# Patient Record
Sex: Female | Born: 1970
Health system: Southern US, Community
[De-identification: ages and names within clinical notes are randomized; demographics above are authoritative.]

## PROBLEM LIST (undated history)

## (undated) DIAGNOSIS — M549 Dorsalgia, unspecified: Secondary | ICD-10-CM

## (undated) DIAGNOSIS — K219 Gastro-esophageal reflux disease without esophagitis: Secondary | ICD-10-CM

## (undated) DIAGNOSIS — G8929 Other chronic pain: Secondary | ICD-10-CM

## (undated) DIAGNOSIS — J329 Chronic sinusitis, unspecified: Secondary | ICD-10-CM

## (undated) DIAGNOSIS — R51 Headache: Secondary | ICD-10-CM

## (undated) DIAGNOSIS — N760 Acute vaginitis: Secondary | ICD-10-CM

## (undated) DIAGNOSIS — M1990 Unspecified osteoarthritis, unspecified site: Secondary | ICD-10-CM

## (undated) DIAGNOSIS — R197 Diarrhea, unspecified: Secondary | ICD-10-CM

## (undated) DIAGNOSIS — R42 Dizziness and giddiness: Secondary | ICD-10-CM

## (undated) DIAGNOSIS — N898 Other specified noninflammatory disorders of vagina: Secondary | ICD-10-CM

## (undated) DIAGNOSIS — R011 Cardiac murmur, unspecified: Secondary | ICD-10-CM

## (undated) DIAGNOSIS — B9689 Other specified bacterial agents as the cause of diseases classified elsewhere: Secondary | ICD-10-CM

## (undated) DIAGNOSIS — I639 Cerebral infarction, unspecified: Secondary | ICD-10-CM

## (undated) DIAGNOSIS — R531 Weakness: Secondary | ICD-10-CM

## (undated) HISTORY — DX: Other specified noninflammatory disorders of vagina: N89.8

## (undated) HISTORY — DX: Other specified bacterial agents as the cause of diseases classified elsewhere: B96.89

## (undated) HISTORY — DX: Acute vaginitis: N76.0

---
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
## 1989-12-15 HISTORY — PX: LAPAROSCOPIC ENDOMETRIOSIS FULGURATION: SUR769

## 2002-12-15 DIAGNOSIS — R011 Cardiac murmur, unspecified: Secondary | ICD-10-CM

## 2002-12-15 HISTORY — DX: Cardiac murmur, unspecified: R01.1

## 2005-05-15 ENCOUNTER — Emergency Department (HOSPITAL_COMMUNITY): Admission: EM | Admit: 2005-05-15 | Discharge: 2005-05-15 | Payer: Self-pay | Admitting: *Deleted

## 2006-09-23 ENCOUNTER — Other Ambulatory Visit: Admission: RE | Admit: 2006-09-23 | Discharge: 2006-09-23 | Payer: Self-pay | Admitting: Obstetrics and Gynecology

## 2006-09-29 ENCOUNTER — Encounter: Admission: RE | Admit: 2006-09-29 | Discharge: 2006-09-29 | Payer: Self-pay | Admitting: Obstetrics and Gynecology

## 2006-10-27 ENCOUNTER — Ambulatory Visit (HOSPITAL_COMMUNITY): Admission: RE | Admit: 2006-10-27 | Discharge: 2006-10-27 | Payer: Self-pay | Admitting: Obstetrics and Gynecology

## 2008-04-11 ENCOUNTER — Other Ambulatory Visit: Admission: RE | Admit: 2008-04-11 | Discharge: 2008-04-11 | Payer: Self-pay | Admitting: Obstetrics and Gynecology

## 2008-10-05 ENCOUNTER — Encounter: Payer: Self-pay | Admitting: Obstetrics & Gynecology

## 2008-10-05 ENCOUNTER — Inpatient Hospital Stay (HOSPITAL_COMMUNITY): Admission: AD | Admit: 2008-10-05 | Discharge: 2008-10-07 | Payer: Self-pay | Admitting: Obstetrics & Gynecology

## 2008-10-05 ENCOUNTER — Ambulatory Visit: Payer: Self-pay | Admitting: Physician Assistant

## 2008-10-06 ENCOUNTER — Encounter: Payer: Self-pay | Admitting: Obstetrics & Gynecology

## 2009-01-04 ENCOUNTER — Other Ambulatory Visit: Admission: RE | Admit: 2009-01-04 | Discharge: 2009-01-04 | Payer: Self-pay | Admitting: Obstetrics and Gynecology

## 2010-01-09 ENCOUNTER — Other Ambulatory Visit: Admission: RE | Admit: 2010-01-09 | Discharge: 2010-01-09 | Payer: Self-pay | Admitting: Obstetrics and Gynecology

## 2010-09-01 ENCOUNTER — Emergency Department (HOSPITAL_COMMUNITY): Admission: EM | Admit: 2010-09-01 | Discharge: 2010-09-01 | Payer: Self-pay | Admitting: Family Medicine

## 2011-01-15 ENCOUNTER — Other Ambulatory Visit (HOSPITAL_COMMUNITY)
Admission: RE | Admit: 2011-01-15 | Discharge: 2011-01-15 | Disposition: A | Discharge status: Home / Self Care | Payer: Medicaid Other | Source: Ambulatory Visit | Attending: Obstetrics and Gynecology | Admitting: Obstetrics and Gynecology

## 2011-01-15 ENCOUNTER — Other Ambulatory Visit: Payer: Self-pay | Admitting: Obstetrics and Gynecology

## 2011-01-15 DIAGNOSIS — Z01419 Encounter for gynecological examination (general) (routine) without abnormal findings: Secondary | ICD-10-CM | POA: Insufficient documentation

## 2011-02-27 LAB — POCT URINALYSIS DIPSTICK
Ketones, ur: NEGATIVE mg/dL
Nitrite: NEGATIVE
Protein, ur: NEGATIVE mg/dL
Urobilinogen, UA: 0.2 mg/dL (ref 0.0–1.0)
pH: 5 (ref 5.0–8.0)

## 2011-02-27 LAB — CBC
HCT: 41.1 % (ref 36.0–46.0)
Hemoglobin: 14 g/dL (ref 12.0–15.0)
MCHC: 34.1 g/dL (ref 30.0–36.0)
RBC: 4.2 MIL/uL (ref 3.87–5.11)

## 2011-02-27 LAB — COMPREHENSIVE METABOLIC PANEL
ALT: 9 U/L (ref 0–35)
AST: 17 U/L (ref 0–37)
Alkaline Phosphatase: 84 U/L (ref 39–117)
CO2: 26 mEq/L (ref 19–32)
Calcium: 9.2 mg/dL (ref 8.4–10.5)
Chloride: 107 mEq/L (ref 96–112)
GFR calc Af Amer: >60 mL/min (ref >60)
GFR calc non Af Amer: >60 mL/min (ref >60)
Glucose, Bld: 102 mg/dL — ABNORMAL HIGH (ref 70–99)
Sodium: 140 mEq/L (ref 135–145)
Total Bilirubin: 0.2 mg/dL — ABNORMAL LOW (ref 0.3–1.2)

## 2011-02-27 LAB — URINE CULTURE: Culture  Setup Time: 201109190041

## 2011-02-27 LAB — DIFFERENTIAL
Basophils Absolute: 0 10*3/uL (ref 0.0–0.1)
Basophils Relative: 0 % (ref 0–1)
Eosinophils Absolute: 0.1 10*3/uL (ref 0.0–0.7)
Eosinophils Relative: 1 % (ref 0–5)
Lymphs Abs: 2.8 10*3/uL (ref 0.7–4.0)
Neutrophils Relative %: 63 % (ref 43–77)

## 2011-02-27 LAB — POCT PREGNANCY, URINE: Preg Test, Ur: NEGATIVE

## 2011-02-27 LAB — LIPASE, BLOOD: Lipase: 26 U/L (ref 11–59)

## 2011-02-27 LAB — POCT H PYLORI SCREEN: H. PYLORI SCREEN, POC: NEGATIVE

## 2011-05-02 NOTE — H&P (Signed)
NAMEPRECIOSA, BUNDRICK                 ACCOUNT NO.:  [id number]   MEDICAL RECORD NO.:  [id number]          PATIENT TYPE:  AMB   LOCATION:  SDC                           FACILITY:  [facility]   PHYSICIAN:  Sherron Monday, MD        DATE OF BIRTH:  May 02, 1971   DATE OF ADMISSION:  10/27/2006  DATE OF DISCHARGE:                                HISTORY & PHYSICAL   REASON FOR ADMISSION:  Patient is being admitted for operative laparoscopy  on October 27, 2006.   HISTORY OF PRESENT ILLNESS:  Ms. Muntean is a 40 year old Caucasian female,  G3, P0-0-3-0 who presents with a history of endometriosis and desirous of a  pregnancy.  She states she has dysmenorrhea and menorrhagia, clots, heavy  bleeding and has no cramping with her menses in the past.  She has taken  Lupron for her endometriosis.   PAST MEDICAL HISTORY:  Significant for asthma.  She has never been  hospitalized.   PAST SURGICAL HISTORY:  Significant for a right knee surgery, surgery on her  vulva, she describes it as a polyp surgery.  She has had a laparoscopy for  endometriosis that was fulgurated.   MEDICATIONS:  None.   ALLERGIES:  No known drug allergies.   SOCIAL HISTORY:  She admits to one-half pack per day tobacco use.  Occasional alcohol use.  No drugs.  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   PAST OBSTETRICAL HISTORY:  She is a G3, P0-0-3-0 with G1-G3 being first  trimester therapeutic abortions.  She has never had an abnormal Pap smear.  Her last was on September 23, 2006 and was within normal limits.  No history  of any sexually transmitted diseases.  Menarche was at age 74.  She states  she has monthly cycles and occasionally these are regular, they last four  days and they are heavy.  She states she occasionally has severe cramping.  She is sexually active, monogamous with a single female partner. She states  she has dyspareunia, no intermenstrual bleeding or postcoital bleeding.   FAMILY HISTORY:   Significant for diabetes, hypertension, unknown type of  cancer and questionable for coronary artery disease.   PHYSICAL EXAMINATION:  VITAL SIGNS:  She is 5 feet, 2 inches tall and weighs  112 pounds.  She had a recent blood pressure of 110/76.  GENERAL APPEARANCE:  She is in no apparent distress.  CARDIOVASCULAR:  Regular rate and rhythm.  LUNGS:  Clear to auscultation bilaterally.  HEENT:  Mucous membranes are moist.  NECK:  Without lymphadenopathy.  BACK:  No costovertebral angle tenderness.  ABDOMEN:  Soft, nontender, nondistended with positive bowel sounds.  EXTREMITIES:  Symmetric and nontender.  PELVIC:  Normal external female genitalia, normal BUS, good support.  On  speculum exam patient was very uncomfortable.  Cervix and vagina without  lesions.  No cervical motion tenderness.  Normal uterus.  The right ovary  was enlarged and nontender.  The left ovary with no masses and nontender.  The uterosacral ligaments were palpated and  thought to be tender with  questionable studding.   She was given advice regarding progesterone-only pills, Depo, Lupron,  Implanon or a total abdominal hysterectomy, bilateral salpingo-oophorectomy  or operative laparoscopy.  She stated she is trying to get pregnant, she  would like to go the more conservative route and have a laparoscopy to treat  any endometriosis.  She was also recommended to quit smoking.  She was  started on prenatal vitamins and advised ovulation predictor kits.  She had  a mammogram as her breast exam revealed some cystic lesions which was within  normal limits.  She is advised of the risks, benefits and alternatives of  this surgical procedure and understands these risks.  She will present for  the surgery on October 27, 2006.      Sherron Monday, MD  Electronically Signed     JB/MEDQ  D:  10/26/2006  T:  10/26/2006  Job:  [id number]

## 2011-05-02 NOTE — Op Note (Signed)
NAME:  Tiffany Barry, Tiffany Barry                 ACCOUNT NO.:  [id number]   MEDICAL RECORD NO.:  [id number]          PATIENT TYPE:  AMB   LOCATION:  SDC                           [facility]ILITY[facility]   PHYSICIAN:  Sherron Monday, MD        DATE OF BIRTH:  [birth year]   DATE OF PROCEDURE:  10/27/2006  DATE OF DISCHARGE:                                 OPERATIVE REPORT   PREOPERATIVE DIAGNOSIS:  Pelvic pain, dysmenorrhea, history of  endometriosis.   POSTOPERATIVE DIAGNOSIS:  Pelvic pain, dysmenorrhea, history of  endometriosis.   OPERATION PERFORMED:  Diagnostic laparoscopy, chromotubation.   SURGEON:  Sherron Monday, MD   ASSISTANT:  Zenaida Niece, M.D.   ANESTHESIA:  General.   PATHOLOGY:  None.   ESTIMATED BLOOD LOSS:  Minimal.   URINE OUTPUT:  100 mL by in and out cath at start of the procedure.   COMPLICATIONS:  None.   DESCRIPTION OF PROCEDURE:  After risks, benefits and alternatives of the  surgical procedure were discussed with the patient.  She was transported  back to the operating room, prepped and draped in the normal sterile fashion  after general anesthesia was obtained.  A ZUMI uterine manipulator was  placed without difficulty after the uterus sounded to approximately 6 cm and  approximately 5 mm infraumbilical incision was made after being infiltrated  with 0.25% Marcaine, using the Veress needle, pneumoperitoneum was obtained  after several attempts had been made to assure that the Veress was in the  abdomen using direct visualization.  The 5 mm scope and trocar were then  placed infraumbilically.  A brief survey of the pelvis was performed  revealing a normal uterus, normal-appearing tubes and ovaries bilaterally,  normal appendix, no endometriosis was noted.  Approximately 5 mm port was  placed on the right side to help with visualization using a blunt trocar.  Visualization was assisted.  Methylene blue was instilled by the ZUMI  manipulator to ensure both tubes were  patent.  Pictures were taken to  confirm this.  The ports were removed under direct visualization.  The gas  was drained from her abdomen.  The ports were closed with 4-0 Vicryl in a  subcuticular fashion.  Sponge, lap, and needle counts were correct times two  by the operating room staff.  After the ZUMI manipulator was removed, she was returned to the supine  position.  The patient tolerated the procedure well.  Again, sponge, lap and  needle counts were correct times two per the operating room staff.  She was  then transported in stable condition to post anesthesia care unit.      Sherron Monday, MD  Electronically Signed     JB/MEDQ  D:  10/27/2006  T:  10/27/2006  Job:  [id number]

## 2011-06-19 ENCOUNTER — Encounter (HOSPITAL_COMMUNITY): Payer: Self-pay

## 2011-06-19 ENCOUNTER — Encounter (HOSPITAL_COMMUNITY)
Admission: RE | Admit: 2011-06-19 | Discharge: 2011-06-19 | Disposition: A | Discharge status: Home / Self Care | Payer: Medicaid Other | Source: Ambulatory Visit | Attending: Obstetrics and Gynecology | Admitting: Obstetrics and Gynecology

## 2011-06-19 ENCOUNTER — Other Ambulatory Visit: Payer: Self-pay | Admitting: Obstetrics and Gynecology

## 2011-06-19 HISTORY — DX: Other chronic pain: G89.29

## 2011-06-19 HISTORY — DX: Gastro-esophageal reflux disease without esophagitis: K21.9

## 2011-06-19 HISTORY — DX: Dorsalgia, unspecified: M54.9

## 2011-06-19 HISTORY — DX: Chronic sinusitis, unspecified: J32.9

## 2011-06-19 LAB — HCG, QUANTITATIVE, PREGNANCY: hCG, Beta Chain, Quant, S: 1 m[IU]/mL (ref <5)

## 2011-06-19 LAB — SURGICAL PCR SCREEN
MRSA, PCR: NEGATIVE
Staphylococcus aureus: NEGATIVE

## 2011-06-19 LAB — CBC
HCT: 37.4 % (ref 36.0–46.0)
MCV: 99.5 fL (ref 78.0–100.0)
RBC: 3.76 MIL/uL — ABNORMAL LOW (ref 3.87–5.11)
RDW: 12.5 % (ref 11.5–15.5)
WBC: 9.5 10*3/uL (ref 4.0–10.5)

## 2011-06-19 LAB — BASIC METABOLIC PANEL
CO2: 23 mEq/L (ref 19–32)
Chloride: 107 mEq/L (ref 96–112)
GFR calc Af Amer: >60 mL/min (ref >60)
Potassium: 4.5 mEq/L (ref 3.5–5.1)

## 2011-06-19 NOTE — Patient Instructions (Signed)
20 Tiffany Barry  06/19/2011   Your procedure is scheduled on:  06/24/11, Tuesday  Report to [facility] at 06:15 AM.  Call this number if you have problems the morning of surgery: [phone]   Remember:   Do not eat food:After Midnight.  Do not drink clear liquids: After Midnight.  Take these medicines the morning of surgery with A SIP OF WATER: prilosec, albuterol. Bring inhaler and do bowel prep; Mag Citrate 1 bottle.   Do not wear jewelry, make-up or nail polish.  Do not bring valuables to the hospital.  Contacts, dentures or bridgework may not be worn into surgery.  Leave suitcase in the car. After surgery it may be brought to your room.  For patients admitted to the hospital, checkout time is 11:00 AM the day of discharge.   Patients discharged the day of surgery will not be allowed to drive home.  Name and phone number of your driver: Daron Offer, husband  Special Instructions: CHG Shower Shower 2 days before surgery and 1 day before surgery with Hibiclens.   Please read over the following fact sheets that you were given: Pain Booklet, Coughing and Deep Breathing, MRSA Information, Surgical Site Infection Prevention and Anesthesia Post-op Instructions   Instructions Following General Anesthetic, Adult A nurse specialized in giving anesthesia (anesthetist) or a doctor specialized in giving anesthesia (anesthesiologist) gave you a medicine that made you sleep while a procedure was performed. For as long as 24 hours following this procedure, you may feel:  Dizzy.   Weak.   Drowsy.  AFTER YOUR SURGERY 1. After surgery, you will be taken to the recovery area where a nurse will monitor your progress. You will be allowed to go home when you are awake, stable, taking fluids well, and without complications.  2.  3. For the first 24 hours following an anesthetic:   Have a responsible person with you.   Do not drive a car. If you are alone, do not take public transportation.   Do not drink  alcohol.   Do not take medicine that has not been prescribed by your caregiver.   Do not sign important papers or make important decisions.   You may resume normal diet and activities as directed.   Change bandages (dressings) as directed.   Only take over-the-counter or prescription medicines for pain, discomfort, or fever as directed by your caregiver.  If you have questions or problems that seem related to the anesthetic, call the hospital and ask for the anesthetist or anesthesiologist on call. SEEK IMMEDIATE MEDICAL CARE IF:  You develop a rash.   You have difficulty breathing.   You have chest pain.   You develop any allergic problems.  Document Released: 03/09/2001 Document Re-Released: 02/25/2010 [facility] Patient Information 2011 [place], [place].

## 2011-06-23 NOTE — Anesthesia Preprocedure Evaluation (Addendum)
Anesthesia Evaluation  Name, MR# and DOB Patient awake  General Assessment Comment  Reviewed: Allergy & Precautions and Patient's Chart, lab work & pertinent test results  History of Anesthesia Complications Negative for: history of anesthetic complications  Airway Mallampati: I TM Distance: >3 FB Neck ROM: Full    Dental  (+) Teeth Intact   Pulmonary  asthma (Used inhaler this AM) and Inhaler asthma  clear to auscultation    Cardiovascular Regular Normal   Neuro/Psych  GI/Hepatic/Renal (+)  GERD Medicated and Controlled     Endo/Other   Abdominal   Musculoskeletal  Hematology   Peds  Reproductive/Obstetrics          Anesthesia Physical Anesthesia Plan  ASA: II  Anesthesia Plan: General   Post-op Pain Management:    Induction: Intravenous, Rapid sequence and Cricoid pressure planned  Airway Management Planned: Oral ETT  Additional Equipment:   Intra-op Plan:   Post-operative Plan:   Informed Consent: I have reviewed the patients History and Physical, chart, labs and discussed the procedure including the risks, benefits and alternatives for the proposed anesthesia with the patient or authorized representative who has indicated his/her understanding and acceptance.     Plan Discussed with: CRNA  Anesthesia Plan Comments:        Anesthesia Quick Evaluation
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
## 2011-06-24 ENCOUNTER — Encounter (HOSPITAL_COMMUNITY): Payer: Self-pay | Admitting: Anesthesiology

## 2011-06-24 ENCOUNTER — Other Ambulatory Visit: Payer: Self-pay | Admitting: Obstetrics and Gynecology

## 2011-06-24 ENCOUNTER — Ambulatory Visit (HOSPITAL_COMMUNITY)
Admission: RE | Admit: 2011-06-24 | Discharge: 2011-06-24 | Disposition: A | Discharge status: Home / Self Care | Payer: Medicaid Other | Source: Ambulatory Visit | Attending: Obstetrics and Gynecology | Admitting: Obstetrics and Gynecology

## 2011-06-24 ENCOUNTER — Encounter (HOSPITAL_COMMUNITY)
Admission: RE | Disposition: A | Discharge status: Home / Self Care | Payer: Self-pay | Source: Ambulatory Visit | Attending: Obstetrics and Gynecology

## 2011-06-24 ENCOUNTER — Encounter (HOSPITAL_COMMUNITY): Payer: Self-pay | Admitting: *Deleted

## 2011-06-24 ENCOUNTER — Ambulatory Visit (HOSPITAL_COMMUNITY): Payer: Medicaid Other | Admitting: Anesthesiology

## 2011-06-24 DIAGNOSIS — J45909 Unspecified asthma, uncomplicated: Secondary | ICD-10-CM | POA: Insufficient documentation

## 2011-06-24 DIAGNOSIS — Z4689 Encounter for fitting and adjustment of other specified devices: Secondary | ICD-10-CM | POA: Insufficient documentation

## 2011-06-24 DIAGNOSIS — N920 Excessive and frequent menstruation with regular cycle: Secondary | ICD-10-CM | POA: Insufficient documentation

## 2011-06-24 DIAGNOSIS — N946 Dysmenorrhea, unspecified: Secondary | ICD-10-CM | POA: Insufficient documentation

## 2011-06-24 DIAGNOSIS — Z01812 Encounter for preprocedural laboratory examination: Secondary | ICD-10-CM | POA: Insufficient documentation

## 2011-06-24 DIAGNOSIS — N814 Uterovaginal prolapse, unspecified: Secondary | ICD-10-CM | POA: Insufficient documentation

## 2011-06-24 DIAGNOSIS — K219 Gastro-esophageal reflux disease without esophagitis: Secondary | ICD-10-CM | POA: Insufficient documentation

## 2011-06-24 HISTORY — PX: NORPLANT REMOVAL: SHX5385

## 2011-06-24 HISTORY — PX: RECTOCELE REPAIR: SHX761

## 2011-06-24 HISTORY — PX: VAGINAL HYSTERECTOMY: SHX2639

## 2011-06-24 SURGERY — HYSTERECTOMY, VAGINAL
Anesthesia: General | Wound class: Clean Contaminated

## 2011-06-24 MED ORDER — LIDOCAINE HCL 1 % IJ SOLN
INTRAMUSCULAR | Status: DC | PRN
  Administered 2011-06-24: 10 mg via INTRADERMAL

## 2011-06-24 MED ORDER — LACTATED RINGERS IV SOLN
INTRAVENOUS | Status: DC | PRN
  Administered 2011-06-24: 07:00:00 via INTRAVENOUS

## 2011-06-24 MED ORDER — SUCCINYLCHOLINE CHLORIDE 20 MG/ML IJ SOLN
INTRAMUSCULAR | Status: DC | PRN
  Administered 2011-06-24: 140 mg via INTRAVENOUS

## 2011-06-24 MED ORDER — FENTANYL CITRATE 0.05 MG/ML IJ SOLN
INTRAMUSCULAR | Status: DC | PRN
  Administered 2011-06-24 (×5): 50 ug via INTRAVENOUS

## 2011-06-24 MED ORDER — ZOLPIDEM TARTRATE 5 MG PO TABS: 5.0000 mg | ORAL_TABLET | Freq: Every evening | ORAL | Status: DC | PRN

## 2011-06-24 MED ORDER — HYDROMORPHONE HCL 2 MG/ML IJ SOLN: 0.2000 mg | INTRAMUSCULAR | Status: DC | PRN

## 2011-06-24 MED ORDER — SODIUM CHLORIDE 0.9 % IJ SOLN
3.0000 mL | Freq: Two times a day (BID) | INTRAMUSCULAR | Status: DC
  Administered 2011-06-24: 3 mL via INTRAVENOUS
  Filled 2011-06-24: qty 3

## 2011-06-24 MED ORDER — LIDOCAINE HCL (PF) 1 % IJ SOLN
INTRAMUSCULAR | Status: AC
  Filled 2011-06-24: qty 5

## 2011-06-24 MED ORDER — ROCURONIUM BROMIDE 100 MG/10ML IV SOLN
INTRAVENOUS | Status: DC | PRN
  Administered 2011-06-24: 5 mg via INTRAVENOUS

## 2011-06-24 MED ORDER — BUPIVACAINE-EPINEPHRINE 0.5% -1:200000 IJ SOLN
INTRAMUSCULAR | Status: DC | PRN
  Administered 2011-06-24: 30 mL

## 2011-06-24 MED ORDER — MIDAZOLAM HCL 2 MG/2ML IJ SOLN
INTRAMUSCULAR | Status: AC
  Filled 2011-06-24: qty 2

## 2011-06-24 MED ORDER — FENTANYL CITRATE 0.05 MG/ML IJ SOLN
INTRAMUSCULAR | Status: AC
  Filled 2011-06-24: qty 2

## 2011-06-24 MED ORDER — SODIUM CHLORIDE 0.9 % IR SOLN
Status: DC | PRN
  Administered 2011-06-24: 1000 mL

## 2011-06-24 MED ORDER — PROPOFOL 10 MG/ML IV EMUL
INTRAVENOUS | Status: AC
  Filled 2011-06-24: qty 20

## 2011-06-24 MED ORDER — LIDOCAINE HCL (PF) 1 % IJ SOLN
INTRAMUSCULAR | Status: AC
  Filled 2011-06-24: qty 2

## 2011-06-24 MED ORDER — ROCURONIUM BROMIDE 50 MG/5ML IV SOLN
INTRAVENOUS | Status: AC
  Filled 2011-06-24: qty 1

## 2011-06-24 MED ORDER — LACTATED RINGERS IV SOLN
INTRAVENOUS | Status: DC
  Administered 2011-06-24: 07:00:00 via INTRAVENOUS

## 2011-06-24 MED ORDER — MENTHOL 3 MG MT LOZG: 1.0000 | LOZENGE | OROMUCOSAL | Status: DC | PRN

## 2011-06-24 MED ORDER — PROMETHAZINE HCL 25 MG/ML IJ SOLN: 12.5000 mg | INTRAMUSCULAR | Status: DC | PRN

## 2011-06-24 MED ORDER — OXYCODONE-ACETAMINOPHEN 5-325 MG PO TABS: 1.0000 | ORAL_TABLET | ORAL | Status: AC | PRN

## 2011-06-24 MED ORDER — ONDANSETRON HCL 4 MG/2ML IJ SOLN
INTRAMUSCULAR | Status: AC
  Filled 2011-06-24: qty 2

## 2011-06-24 MED ORDER — PROMETHAZINE HCL 25 MG PO TABS: 25.0000 mg | ORAL_TABLET | Freq: Four times a day (QID) | ORAL | Status: DC | PRN

## 2011-06-24 MED ORDER — SODIUM CHLORIDE 0.9 % IJ SOLN: 3.0000 mL | INTRAMUSCULAR | Status: DC | PRN

## 2011-06-24 MED ORDER — ONDANSETRON HCL 4 MG/2ML IJ SOLN: 4.0000 mg | Freq: Once | INTRAMUSCULAR | Status: DC | PRN

## 2011-06-24 MED ORDER — SUCCINYLCHOLINE CHLORIDE 20 MG/ML IJ SOLN
INTRAMUSCULAR | Status: AC
  Filled 2011-06-24: qty 1

## 2011-06-24 MED ORDER — CEFAZOLIN SODIUM 1-5 GM-% IV SOLN
INTRAVENOUS | Status: DC | PRN
  Administered 2011-06-24: 1 g via INTRAVENOUS

## 2011-06-24 MED ORDER — BUPIVACAINE-EPINEPHRINE PF 0.5-1:200000 % IJ SOLN
INTRAMUSCULAR | Status: AC
  Filled 2011-06-24: qty 20

## 2011-06-24 MED ORDER — GLYCOPYRROLATE 0.2 MG/ML IJ SOLN
0.2000 mg | Freq: Once | INTRAMUSCULAR | Status: AC
  Administered 2011-06-24: 0.2 mg via INTRAVENOUS

## 2011-06-24 MED ORDER — GLYCOPYRROLATE 0.2 MG/ML IJ SOLN
INTRAMUSCULAR | Status: AC
  Filled 2011-06-24: qty 1

## 2011-06-24 MED ORDER — KETOROLAC TROMETHAMINE 30 MG/ML IJ SOLN: 30.0000 mg | Freq: Once | INTRAMUSCULAR | Status: DC

## 2011-06-24 MED ORDER — KETOROLAC TROMETHAMINE 30 MG/ML IJ SOLN
30.0000 mg | Freq: Four times a day (QID) | INTRAMUSCULAR | Status: DC
  Administered 2011-06-24: 30 mg via INTRAVENOUS
  Filled 2011-06-24: qty 1

## 2011-06-24 MED ORDER — PANTOPRAZOLE SODIUM 40 MG PO TBEC: 40.0000 mg | DELAYED_RELEASE_TABLET | Freq: Every day | ORAL | Status: DC

## 2011-06-24 MED ORDER — HYDROMORPHONE HCL 1 MG/ML IJ SOLN
0.2500 mg | INTRAMUSCULAR | Status: DC | PRN
  Administered 2011-06-24 (×4): 0.5 mg via INTRAVENOUS

## 2011-06-24 MED ORDER — ACETAMINOPHEN 500 MG PO TABS: 500.0000 mg | ORAL_TABLET | ORAL | Status: DC | PRN

## 2011-06-24 MED ORDER — CEFAZOLIN SODIUM 1-5 GM-% IV SOLN
1.0000 g | Freq: Once | INTRAVENOUS | Status: DC
  Filled 2011-06-24: qty 50

## 2011-06-24 MED ORDER — ONDANSETRON HCL 4 MG/2ML IJ SOLN
4.0000 mg | Freq: Once | INTRAMUSCULAR | Status: AC
  Administered 2011-06-24: 4 mg via INTRAVENOUS

## 2011-06-24 MED ORDER — KCL IN DEXTROSE-NACL 20-5-0.45 MEQ/L-%-% IV SOLN: INTRAVENOUS | Status: DC

## 2011-06-24 MED ORDER — MIDAZOLAM HCL 2 MG/2ML IJ SOLN
1.0000 mg | INTRAMUSCULAR | Status: DC | PRN
  Administered 2011-06-24: 2 mg via INTRAVENOUS

## 2011-06-24 MED ORDER — OXYCODONE-ACETAMINOPHEN 5-325 MG PO TABS
1.0000 | ORAL_TABLET | ORAL | Status: DC | PRN
  Administered 2011-06-24: 2 via ORAL
  Administered 2011-06-24: 1 via ORAL
  Filled 2011-06-24: qty 1
  Filled 2011-06-24: qty 2

## 2011-06-24 MED ORDER — FENTANYL CITRATE 0.05 MG/ML IJ SOLN
INTRAMUSCULAR | Status: AC
  Filled 2011-06-24: qty 5

## 2011-06-24 MED ORDER — PROPOFOL 10 MG/ML IV EMUL
INTRAVENOUS | Status: DC | PRN
  Administered 2011-06-24: 150 mg via INTRAVENOUS
  Administered 2011-06-24: 50 mg via INTRAVENOUS

## 2011-06-24 MED ORDER — CEFAZOLIN SODIUM 1-5 GM-% IV SOLN
INTRAVENOUS | Status: AC
  Filled 2011-06-24: qty 50

## 2011-06-24 MED ORDER — KETOROLAC TROMETHAMINE 30 MG/ML IJ SOLN: 30.0000 mg | Freq: Four times a day (QID) | INTRAMUSCULAR | Status: DC

## 2011-06-24 MED ORDER — HYDROMORPHONE HCL 2 MG/ML IJ SOLN
INTRAMUSCULAR | Status: AC
  Filled 2011-06-24: qty 1

## 2011-06-24 SURGICAL SUPPLY — 47 items
BAG HAMPER (MISCELLANEOUS) ×2 IMPLANT
CLOTH BEACON ORANGE TIMEOUT ST (SAFETY) ×2 IMPLANT
COVER LIGHT HANDLE STERIS (MISCELLANEOUS) ×4 IMPLANT
DECANTER SPIKE VIAL GLASS SM (MISCELLANEOUS) ×2 IMPLANT
DRAPE EENT ADH APERT 31X51 STR (DRAPES) ×1 IMPLANT
DRAPE PROXIMA HALF (DRAPES) ×2 IMPLANT
DRAPE STERI URO 9X17 APER PCH (DRAPES) ×2 IMPLANT
ELECT REM PT RETURN 9FT ADLT (ELECTROSURGICAL) ×2
ELECTRODE REM PT RTRN 9FT ADLT (ELECTROSURGICAL) ×1 IMPLANT
FORMALIN 10 PREFIL 480ML (MISCELLANEOUS) ×2 IMPLANT
GAUZE PACKING 2X5 YD STERILE (GAUZE/BANDAGES/DRESSINGS) ×2 IMPLANT
GLOVE BIO SURGEON STRL SZ8.5 (GLOVE) ×1 IMPLANT
GLOVE BIOGEL PI IND STRL 7.0 (GLOVE) IMPLANT
GLOVE BIOGEL PI INDICATOR 7.0 (GLOVE) ×3
GLOVE ECLIPSE 6.5 STRL STRAW (GLOVE) ×2 IMPLANT
GLOVE ECLIPSE 7.0 STRL STRAW (GLOVE) ×1 IMPLANT
GLOVE ECLIPSE 9.0 STRL (GLOVE) ×3 IMPLANT
GLOVE EXAM NITRILE MD LF STRL (GLOVE) ×1 IMPLANT
GLOVE INDICATOR STER SZ 9 (GLOVE) ×2 IMPLANT
GOWN BRE IMP SLV AUR XL STRL (GOWN DISPOSABLE) ×4 IMPLANT
GOWN STRL REIN 3XL LVL4 (GOWN DISPOSABLE) ×2 IMPLANT
IV NS IRRIG 3000ML ARTHROMATIC (IV SOLUTION) ×2 IMPLANT
KIT ROOM TURNOVER AP CYSTO (KITS) ×2 IMPLANT
MANIFOLD NEPTUNE II (INSTRUMENTS) ×2 IMPLANT
NDL HYPO 25X1 1.5 SAFETY (NEEDLE) ×1 IMPLANT
NEEDLE HYPO 25X1 1.5 SAFETY (NEEDLE) ×2 IMPLANT
NS IRRIG 1000ML POUR BTL (IV SOLUTION) ×2 IMPLANT
PACK PERI GYN (CUSTOM PROCEDURE TRAY) ×2 IMPLANT
PAD ARMBOARD 7.5X6 YLW CONV (MISCELLANEOUS) ×2 IMPLANT
RETRACTOR LONRSTAR 16.6X16.6CM (MISCELLANEOUS) ×1 IMPLANT
RETRACTOR STAY HOOK 5MM (MISCELLANEOUS) ×2 IMPLANT
RETRACTOR STER APS 16.6X16.6CM (MISCELLANEOUS) ×2
SET BASIN LINEN APH (SET/KITS/TRAYS/PACK) ×2 IMPLANT
SET IV ADMIN VERSALIGHT (MISCELLANEOUS) ×2 IMPLANT
STRIP CLOSURE SKIN 1/4X3 (GAUZE/BANDAGES/DRESSINGS) ×3 IMPLANT
SUT CHROMIC 0 CT 1 (SUTURE) ×14 IMPLANT
SUT CHROMIC 2 0 CT 1 (SUTURE) ×4 IMPLANT
SUT CHROMIC GUT BROWN 0 54 (SUTURE) ×1 IMPLANT
SUT CHROMIC GUT BROWN 0 54IN (SUTURE) ×2
SUT PROLENE 0 CT 1 30 (SUTURE) ×1 IMPLANT
SUT PROLENE 2 0 SH 30 (SUTURE) IMPLANT
SUT VIC AB 0 CT2 8-18 (SUTURE) ×1 IMPLANT
SYR CONTROL 10ML LL (SYRINGE) ×1 IMPLANT
TOWEL OR 17X26 4PK STRL BLUE (TOWEL DISPOSABLE) ×2 IMPLANT
TRAY FOLEY CATH 16FR SILVER (SET/KITS/TRAYS/PACK) ×2 IMPLANT
VERSALIGHT (MISCELLANEOUS) ×2 IMPLANT
WATER STERILE IRR 1000ML POUR (IV SOLUTION) ×2 IMPLANT

## 2011-06-24 NOTE — Op Note (Signed)
Dictated as dictation number [id number]

## 2011-06-24 NOTE — Discharge Summary (Signed)
  Patient has been able to eat lunch without problems, has had foley removed and has voided, and has had a BM, loose. Good urine output.  Pain adequae. tely controlled by p.o Percocet.   No nausea. P.Ex:  Abd soft, no rebound.           Legs soft.  A Stable postop day 0 s/p TVH post repair Plan:  D/C home this pm.           Rx Percocet/ Phenergan.

## 2011-06-24 NOTE — Op Note (Signed)
NAME:  Tiffany Barry, Tiffany Barry                 ACCOUNT NO.:  [id number]  MEDICAL RECORD NO.:  [id number]  LOCATION:                                 FACILITY:  PHYSICIAN:  Tilda Burrow, M.D. DATE OF BIRTH:  1971/01/28  DATE OF PROCEDURE: DATE OF DISCHARGE:                              OPERATIVE REPORT   PREOPERATIVE DIAGNOSES:  First-degree uterine descensus, rectocele, history of menorrhagia, dysmenorrhea, Implanon contraception expiring.  POSTOPERATIVE DIAGNOSES:  First-degree uterine descensus, rectocele, history of menorrhagia, dysmenorrhea, Implanon contraception expiring.  PROCEDURE: 1. Vaginal hysterectomy. 2. Posterior repair. 3. Removal of Implanon, left upper arm.  SURGEON:  Tilda Burrow, MD  ASSISTANT:  None.  ANESTHESIA:  General.  COMPLICATIONS:  None.  FINDINGS:  First-degree uterine descensus tiny uterus, normal-appearing ovaries and good bladder support.  The posterior vaginal wall defect, site specific just to the left of the midline, re-corrected Implanon in the left upper arm easily identified and removed intact.  DETAILS OF PROCEDURE:  The patient was taken to the operating room, prepped and draped.  Time-out conducted, confirmed by all involved parties.  IV Ancef 1 gram administered.  After prepping and draping the cervix was grasped with Lahey thyroid tenaculum.  Cervix infiltrated around its periphery with Marcaine solution, then the anterior vesicouterine reflection of peritoneum was identified in the anterior colpotomy achieved without difficulty.  Bladder could be retracted away. Posteriorly, a posterior colpotomy incision was made as well and then left and right uterosacral ligaments were serially clamped with Zeppelin clamp, transected and sutured with 0 chromic intact.  Weighted short speculum was in the posterior vagina for access.  Lateral sidewall retractors were used for visibility.  The lower cardinal ligaments were then clamped, cut, and  suture ligated using similar technique and 0 chromic suture, then the upper cardinal ligaments and broad ligaments clamped, cut, and suture ligated in serial fashion on each side with good hemostasis.  Pedicles involving the utero-ovarian ligament and round ligament pedicles were easily ligated and intact.  Subsequent inspection confirmed hemostasis.  Foley catheter was inserted in the bladder confirming a clear urine and 200 mL were obtained.  The anterior peritoneum was closed using running suture of 2-0 chromic with easy approximation.  The vaginal cuff was closed with a series of interrupted sutures of 0 chromic and leaving a transverse incision line.  ESTIMATED BLOOD LOSS:  For this was less 50 mL.  Posterior repair.  The double gloved index finger was used to identify the defect site in the posterior wall by doing a digital rectal exam. The glove was changed.  Allis clamps were placed inside the posterior fourchette of the hymen remnants and then the vaginal epithelium infiltrated with Marcaine solution and elevated off of the connective tissue beneath the vaginal epithelium for a distance of 3 cm up from vagina and slightly further up laterally.  A second exam with digital rectal exam allowed [place] to identify good supportive tissue lateral to the rectum and this was able to be grasped and pulled together into the midline.  Gloves were changed and sterile technique maintained.  The site defect was pulled together grasping the relaxed tissue which was thicker  on the patient's right side over to the patient's left side and cephalad to dramatically improve the integrity and support of the rectovaginal septum.  The patient tolerated this well and had a nice reinforced result of the posterior perineal body.  Vaginal epithelium was then pulled together in the midline with interrupted 2-0 chromic sutures.  Vaginal packing with Betadine-soaked gauze was then placed in the vagina and the  patient's leggings put down in the horizontal supine position.  Implanon removal.  Left arm was prepped over the Implanon site draped and then a small 3-mm incision made over the distal tip of the Implanon and then the Implanon was removed intact and Steri-Strips applied to the incision site.  The patient tolerated procedure well.  Sponge and needle counts were correct for all portions of procedure and the patient went to recovery room in stable condition.  The postop period, the patient's pain level was minimal.  Of note that Marcaine was injected into the end of pedicles lateral to the utero-ovarian pedicles and fallopian tube pedicles at the appropriate time during the case.     Tilda Burrow, M.D.     JVF/MEDQ  D:  06/24/2011  T:  06/24/2011  Job:  [id number]

## 2011-06-24 NOTE — Transfer of Care (Signed)
Immediate Anesthesia Transfer of Care Note  Patient: Tiffany Barry  Procedure(s) Performed:  HYSTERECTOMY VAGINAL; REMOVAL OF NORPLANT - Implanon Removal; POSTERIOR REPAIR (RECTOCELE)  Patient Location: PACU  Anesthesia Type: General  Level of Consciousness: awake  Airway & Oxygen Therapy: Patient Spontanous Breathing  Post-op Assessment: Report given to PACU RN, Post -op Vital signs reviewed and stable and Patient moving all extremities  Post vital signs: stable  Complications: No apparent anesthesia complications

## 2011-06-24 NOTE — Anesthesia Postprocedure Evaluation (Signed)
  Anesthesia Post-op Note  Patient: Tiffany Barry  Procedure(s) Performed:  HYSTERECTOMY VAGINAL; REMOVAL OF NORPLANT - Implanon Removal; POSTERIOR REPAIR (RECTOCELE)  Patient Location: PACU  Anesthesia Type: General  Level of Consciousness: alert   Airway and Oxygen Therapy: Patient Spontanous Breathing  Post-op Pain: none  Post-op Assessment: Patient's Cardiovascular Status Stable, Respiratory Function Stable, Patent Airway and Pain level controlled  Post-op Vital Signs: stable  Complications: No apparent anesthesia complications

## 2011-06-24 NOTE — Brief Op Note (Signed)
06/24/2011  1:16 PM  PATIENT:  Tiffany Barry  40 y.o. female  PRE-OPERATIVE DIAGNOSIS:  menorrhagia,dysmenorrhea, rectocele  POST-OPERATIVE DIAGNOSIS:  menorrhagia,dysmenorrhea, rectocele  PROCEDURE:  Procedure(s): HYSTERECTOMY VAGINAL REMOVAL OF NORPLANT POSTERIOR REPAIR (RECTOCELE)  SURGEON:  Surgeon(s): Tilda Burrow  PHYSICIAN ASSISTANT:   ASSISTANTS: none   ANESTHESIA:   general  ESTIMATED BLOOD LOSS: * No blood loss amount entered *   BLOOD ADMINISTERED:none  DRAINS: none   LOCAL MEDICATIONS USED:  NONE  SPECIMEN:  Cervix and uterus, vaginal epithelium  DISPOSITION OF SPECIMEN:  PATHOLOGY  COUNTS:  YES  TOURNIQUET:  * No tourniquets in log *  DICTATION #: [id number]  PLAN OF CARE: brief post op observation overnite, possible discharge this pm.  PATIENT DISPOSITION:  PACU - hemodynamically stable.         06/24/2011  1:18 PM  PATIENT:  Tiffany Barry  40 y.o. female  PRE-OPERATIVE DIAGNOSIS:  menorrhagia,dysmenorrhea, rectocele  POST-OPERATIVE DIAGNOSIS:  menorrhagia,dysmenorrhea, rectocele  PROCEDURE:  Procedure(s): HYSTERECTOMY VAGINAL REMOVAL OF NORPLANT POSTERIOR REPAIR (RECTOCELE)  SURGEON:  Surgeon(s): Tilda Burrow  PHYSICIAN ASSISTANT:   ASSISTANTS: none   ANESTHESIA:   general  ESTIMATED BLOOD LOSS: * No blood loss amount entered *   BLOOD ADMINISTERED:none  DRAINS: none   LOCAL MEDICATIONS USED:  MARCAINE 30CC  SPECIMEN:  No Specimen  DISPOSITION OF SPECIMEN:  PATHOLOGY  COUNTS:  YES  TOURNIQUET:  * No tourniquets in log *  DICTATION #: [id number]  PLAN OF CARE: discharge this pm or in am. Inpatient observation  PATIENT DISPOSITION:  PACU - hemodynamically stable.

## 2011-06-24 NOTE — Progress Notes (Addendum)
Pt discharged with instructions, prescriptions, and carenotes. She verbalizes understanding. The pt left the floor ambulating with staff and family in stable condition.  No further questions or concerns noted at this time.

## 2011-06-24 NOTE — Progress Notes (Signed)
Patient has been able to eat lunch without problems, has had foley removed and has voided, and has had a BM, loose. Good urine output.  Pain adequae. tely controlled by p.o Percocet.   No nausea. P.Ex:  Abd soft, no rebound.           Legs soft.  A Stable postop day 0 s/p TVH post repair Plan:  D/C home this pm.           Rx Percocet/ Phenergan.

## 2011-06-24 NOTE — Anesthesia Procedure Notes (Addendum)
Procedure Name: Intubation Date/Time: 06/24/2011 8:17 AM Performed by: Glynn Octave Pre-anesthesia Checklist: Patient identified, Patient being monitored, Emergency Drugs available and Suction available Patient Re-evaluated:Patient Re-evaluated prior to inductionOxygen Delivery Method: Circle System Utilized Preoxygenation: Pre-oxygenation with 100% oxygen Intubation Type: IV induction, Rapid sequence and Circoid Pressure applied Laryngoscope Size: Hyacinth Meeker and 2 Laryngoscope size: Miller and 2 Grade View: Grade I Tube type: Oral Tube size: 7.0 mm Number of attempts: 1 Airway Equipment and Method: stylet Placement Confirmation: ETT inserted through vocal cords under direct vision,  positive ETCO2,  CO2 detector and breath sounds checked- equal and bilateral Secured at: 20.5 cm Tube secured with: Tape Dental Injury: Teeth and Oropharynx as per pre-operative assessment     Performed by: Glynn Octave

## 2011-06-24 NOTE — Progress Notes (Signed)
Patient has been able to eat lunch without problems, has had foley removed and has voided, and has had a BM, loose.  Good urine output.  Pain adequately controlled by p.o Percocet.   No nausea. P.Ex:  Abd soft, no rebound.           Legs soft.  A Stable postop day 0 s/p TVH post repair Plan:  D/C home this pm.           Rx Percocet/ Phenergan.

## 2011-06-24 NOTE — Anesthesia Postprocedure Evaluation (Signed)
  Anesthesia Post-op Note  Patient: Tiffany Barry  Procedure(s) Performed:  HYSTERECTOMY VAGINAL; REMOVAL OF NORPLANT - Implanon Removal; POSTERIOR REPAIR (RECTOCELE)  Patient Location: PACU  Anesthesia Type: General  Level of Consciousness: awake, alert  and oriented  Airway and Oxygen Therapy: Patient Spontanous Breathing  Post-op Pain: 6 /10  Post-op Assessment: Post-op Vital signs reviewed, Patient's Cardiovascular Status Stable, Respiratory Function Stable, Patent Airway, No signs of Nausea or vomiting and Pain level controlled  Post-op Vital Signs: stable  Complications: No apparent anesthesia complications

## 2011-07-07 ENCOUNTER — Encounter (HOSPITAL_COMMUNITY): Payer: Self-pay | Admitting: Obstetrics and Gynecology

## 2011-09-15 LAB — COMPREHENSIVE METABOLIC PANEL
Alkaline Phosphatase: 157 — ABNORMAL HIGH
BUN: 5 — ABNORMAL LOW
Calcium: 8.8
Glucose, Bld: 69 — ABNORMAL LOW
Potassium: 4.1
Total Protein: 6

## 2011-09-15 LAB — URINALYSIS, DIPSTICK ONLY
Glucose, UA: NEGATIVE
Leukocytes, UA: NEGATIVE
Specific Gravity, Urine: 1.01
Urobilinogen, UA: 0.2

## 2011-09-15 LAB — LACTATE DEHYDROGENASE: LDH: 193

## 2011-09-15 LAB — URIC ACID: Uric Acid, Serum: 5.1

## 2011-09-15 LAB — RAPID URINE DRUG SCREEN, HOSP PERFORMED
Amphetamines: NOT DETECTED
Barbiturates: NOT DETECTED
Opiates: NOT DETECTED

## 2011-09-15 LAB — RPR: RPR Ser Ql: NONREACTIVE

## 2011-09-15 LAB — CBC
HCT: 41.4
Hemoglobin: 13.9
MCHC: 33.5
RDW: 13.8

## 2012-12-17 ENCOUNTER — Other Ambulatory Visit: Payer: Self-pay | Admitting: Obstetrics and Gynecology

## 2012-12-22 ENCOUNTER — Ambulatory Visit (HOSPITAL_COMMUNITY)
Admission: RE | Admit: 2012-12-22 | Discharge: 2012-12-22 | Disposition: A | Discharge status: Home / Self Care | Payer: Medicaid Other | Source: Ambulatory Visit | Attending: Obstetrics and Gynecology | Admitting: Obstetrics and Gynecology

## 2012-12-22 ENCOUNTER — Other Ambulatory Visit: Payer: Self-pay | Admitting: Obstetrics and Gynecology

## 2012-12-22 DIAGNOSIS — N63 Unspecified lump in unspecified breast: Secondary | ICD-10-CM | POA: Insufficient documentation

## 2013-02-17 ENCOUNTER — Ambulatory Visit (INDEPENDENT_AMBULATORY_CARE_PROVIDER_SITE_OTHER): Payer: Medicaid Other | Admitting: Otolaryngology

## 2013-02-17 DIAGNOSIS — J343 Hypertrophy of nasal turbinates: Secondary | ICD-10-CM

## 2013-02-17 DIAGNOSIS — J342 Deviated nasal septum: Secondary | ICD-10-CM

## 2013-02-22 ENCOUNTER — Other Ambulatory Visit (INDEPENDENT_AMBULATORY_CARE_PROVIDER_SITE_OTHER): Payer: Self-pay | Admitting: Otolaryngology

## 2013-02-22 DIAGNOSIS — J329 Chronic sinusitis, unspecified: Secondary | ICD-10-CM

## 2013-03-07 ENCOUNTER — Ambulatory Visit (HOSPITAL_COMMUNITY)
Admission: RE | Admit: 2013-03-07 | Discharge: 2013-03-07 | Disposition: A | Discharge status: Home / Self Care | Payer: Medicaid Other | Source: Ambulatory Visit | Attending: Otolaryngology | Admitting: Otolaryngology

## 2013-03-07 DIAGNOSIS — J329 Chronic sinusitis, unspecified: Secondary | ICD-10-CM | POA: Insufficient documentation

## 2013-03-07 DIAGNOSIS — R51 Headache: Secondary | ICD-10-CM | POA: Insufficient documentation

## 2013-03-19 ENCOUNTER — Other Ambulatory Visit: Payer: Self-pay | Admitting: Obstetrics and Gynecology

## 2013-04-07 ENCOUNTER — Ambulatory Visit (INDEPENDENT_AMBULATORY_CARE_PROVIDER_SITE_OTHER): Payer: Medicaid Other | Admitting: Otolaryngology

## 2013-04-07 DIAGNOSIS — J343 Hypertrophy of nasal turbinates: Secondary | ICD-10-CM

## 2013-04-07 DIAGNOSIS — J342 Deviated nasal septum: Secondary | ICD-10-CM

## 2013-04-08 ENCOUNTER — Encounter (HOSPITAL_BASED_OUTPATIENT_CLINIC_OR_DEPARTMENT_OTHER): Payer: Self-pay | Admitting: *Deleted

## 2013-04-08 NOTE — Progress Notes (Signed)
No heart problems Hx asthma, allergies-controlled-does smoke-

## 2013-04-12 ENCOUNTER — Encounter (HOSPITAL_BASED_OUTPATIENT_CLINIC_OR_DEPARTMENT_OTHER): Payer: Self-pay | Admitting: *Deleted

## 2013-04-12 ENCOUNTER — Encounter (HOSPITAL_BASED_OUTPATIENT_CLINIC_OR_DEPARTMENT_OTHER)
Admission: RE | Disposition: A | Discharge status: Home / Self Care | Payer: Self-pay | Source: Ambulatory Visit | Attending: Otolaryngology

## 2013-04-12 ENCOUNTER — Ambulatory Visit (HOSPITAL_BASED_OUTPATIENT_CLINIC_OR_DEPARTMENT_OTHER): Payer: Medicaid Other | Admitting: *Deleted

## 2013-04-12 ENCOUNTER — Ambulatory Visit (HOSPITAL_BASED_OUTPATIENT_CLINIC_OR_DEPARTMENT_OTHER)
Admission: RE | Admit: 2013-04-12 | Discharge: 2013-04-12 | Disposition: A | Discharge status: Home / Self Care | Payer: Medicaid Other | Source: Ambulatory Visit | Attending: Otolaryngology | Admitting: Otolaryngology

## 2013-04-12 DIAGNOSIS — J342 Deviated nasal septum: Secondary | ICD-10-CM | POA: Insufficient documentation

## 2013-04-12 DIAGNOSIS — Z9889 Other specified postprocedural states: Secondary | ICD-10-CM

## 2013-04-12 DIAGNOSIS — J343 Hypertrophy of nasal turbinates: Secondary | ICD-10-CM

## 2013-04-12 DIAGNOSIS — J3489 Other specified disorders of nose and nasal sinuses: Secondary | ICD-10-CM | POA: Insufficient documentation

## 2013-04-12 HISTORY — PX: TURBINATE RESECTION: SHX6158

## 2013-04-12 HISTORY — PX: SEPTOPLASTY: SHX2393

## 2013-04-12 LAB — POCT HEMOGLOBIN-HEMACUE: Hemoglobin: 14.8 g/dL (ref 12.0–15.0)

## 2013-04-12 SURGERY — SEPTOPLASTY, NOSE
Anesthesia: General | Site: Nose | Wound class: Clean Contaminated

## 2013-04-12 MED ORDER — LACTATED RINGERS IV SOLN
INTRAVENOUS | Status: DC
  Administered 2013-04-12: 09:00:00 via INTRAVENOUS

## 2013-04-12 MED ORDER — MEPERIDINE HCL 25 MG/ML IJ SOLN: 6.2500 mg | INTRAMUSCULAR | Status: DC | PRN

## 2013-04-12 MED ORDER — MIDAZOLAM HCL 2 MG/2ML IJ SOLN: 1.0000 mg | INTRAMUSCULAR | Status: DC | PRN

## 2013-04-12 MED ORDER — FENTANYL CITRATE 0.05 MG/ML IJ SOLN: 50.0000 ug | INTRAMUSCULAR | Status: DC | PRN

## 2013-04-12 MED ORDER — OXYCODONE-ACETAMINOPHEN 5-325 MG PO TABS: 1.0000 | ORAL_TABLET | Freq: Four times a day (QID) | ORAL | Status: DC | PRN

## 2013-04-12 MED ORDER — ACETAMINOPHEN 10 MG/ML IV SOLN: 1000.0000 mg | Freq: Once | INTRAVENOUS | Status: DC

## 2013-04-12 MED ORDER — LACTATED RINGERS IV SOLN: INTRAVENOUS | Status: DC

## 2013-04-12 MED ORDER — LIDOCAINE HCL (CARDIAC) 20 MG/ML IV SOLN
INTRAVENOUS | Status: DC | PRN
  Administered 2013-04-12: 60 mg via INTRAVENOUS

## 2013-04-12 MED ORDER — FENTANYL CITRATE 0.05 MG/ML IJ SOLN
25.0000 ug | INTRAMUSCULAR | Status: DC | PRN
  Administered 2013-04-12: 50 ug via INTRAVENOUS

## 2013-04-12 MED ORDER — BACITRACIN ZINC 500 UNIT/GM EX OINT
TOPICAL_OINTMENT | CUTANEOUS | Status: DC | PRN
  Administered 2013-04-12: 1 via TOPICAL

## 2013-04-12 MED ORDER — AMOXICILLIN 875 MG PO TABS: 875.0000 mg | ORAL_TABLET | Freq: Two times a day (BID) | ORAL | Status: AC

## 2013-04-12 MED ORDER — ALBUTEROL SULFATE HFA 108 (90 BASE) MCG/ACT IN AERS
INHALATION_SPRAY | RESPIRATORY_TRACT | Status: DC | PRN
  Administered 2013-04-12: 2 via RESPIRATORY_TRACT

## 2013-04-12 MED ORDER — MIDAZOLAM HCL 5 MG/5ML IJ SOLN
INTRAMUSCULAR | Status: DC | PRN
  Administered 2013-04-12: 2 mg via INTRAVENOUS

## 2013-04-12 MED ORDER — KETOROLAC TROMETHAMINE 30 MG/ML IJ SOLN
30.0000 mg | Freq: Once | INTRAMUSCULAR | Status: AC | PRN
  Administered 2013-04-12: 30 mg via INTRAVENOUS

## 2013-04-12 MED ORDER — PROMETHAZINE HCL 25 MG/ML IJ SOLN: 6.2500 mg | INTRAMUSCULAR | Status: DC | PRN

## 2013-04-12 MED ORDER — FENTANYL CITRATE 0.05 MG/ML IJ SOLN
INTRAMUSCULAR | Status: DC | PRN
  Administered 2013-04-12: 100 ug via INTRAVENOUS
  Administered 2013-04-12: 25 ug via INTRAVENOUS

## 2013-04-12 MED ORDER — ONDANSETRON HCL 4 MG/2ML IJ SOLN
INTRAMUSCULAR | Status: DC | PRN
  Administered 2013-04-12: 4 mg via INTRAVENOUS

## 2013-04-12 MED ORDER — DEXAMETHASONE SODIUM PHOSPHATE 4 MG/ML IJ SOLN
INTRAMUSCULAR | Status: DC | PRN
  Administered 2013-04-12: 10 mg via INTRAVENOUS

## 2013-04-12 MED ORDER — PROPOFOL 10 MG/ML IV BOLUS
INTRAVENOUS | Status: DC | PRN
  Administered 2013-04-12: 160 mg via INTRAVENOUS

## 2013-04-12 MED ORDER — LIDOCAINE-EPINEPHRINE 1 %-1:100000 IJ SOLN
INTRAMUSCULAR | Status: DC | PRN
  Administered 2013-04-12: 5 mL

## 2013-04-12 MED ORDER — OXYMETAZOLINE HCL 0.05 % NA SOLN
NASAL | Status: DC | PRN
  Administered 2013-04-12: 1 via NASAL

## 2013-04-12 MED ORDER — LACTATED RINGERS IV SOLN
INTRAVENOUS | Status: DC
  Administered 2013-04-12 (×3): via INTRAVENOUS

## 2013-04-12 MED ORDER — SUCCINYLCHOLINE CHLORIDE 20 MG/ML IJ SOLN
INTRAMUSCULAR | Status: DC | PRN
  Administered 2013-04-12: 100 mg via INTRAVENOUS

## 2013-04-12 SURGICAL SUPPLY — 40 items
ATTRACTOMAT 16X20 MAGNETIC DRP (DRAPES) IMPLANT
BLADE SURG 15 STRL LF DISP TIS (BLADE) IMPLANT
BLADE SURG 15 STRL SS (BLADE)
CANISTER SUCTION 1200CC (MISCELLANEOUS) ×4 IMPLANT
CLOTH BEACON ORANGE TIMEOUT ST (SAFETY) ×4 IMPLANT
COAGULATOR SUCT 8FR VV (MISCELLANEOUS) ×4 IMPLANT
COVER MAYO STAND STRL (DRAPES) ×3 IMPLANT
DECANTER SPIKE VIAL GLASS SM (MISCELLANEOUS) IMPLANT
DRSG NASOPORE 8CM (GAUZE/BANDAGES/DRESSINGS) IMPLANT
DRSG TELFA 3X8 NADH (GAUZE/BANDAGES/DRESSINGS) IMPLANT
ELECT REM PT RETURN 9FT ADLT (ELECTROSURGICAL) ×4
ELECTRODE REM PT RTRN 9FT ADLT (ELECTROSURGICAL) ×3 IMPLANT
GLOVE BIO SURGEON STRL SZ7.5 (GLOVE) ×4 IMPLANT
GLOVE ECLIPSE 6.5 STRL STRAW (GLOVE) ×2 IMPLANT
GLOVE INDICATOR 6.5 STRL GRN (GLOVE) ×1 IMPLANT
GOWN PREVENTION PLUS XLARGE (GOWN DISPOSABLE) ×6 IMPLANT
NDL HYPO 25X1 1.5 SAFETY (NEEDLE) ×3 IMPLANT
NEEDLE HYPO 25X1 1.5 SAFETY (NEEDLE) ×4 IMPLANT
NS IRRIG 1000ML POUR BTL (IV SOLUTION) ×1 IMPLANT
PACK BASIN DAY SURGERY FS (CUSTOM PROCEDURE TRAY) ×4 IMPLANT
PACK ENT DAY SURGERY (CUSTOM PROCEDURE TRAY) ×4 IMPLANT
PAD DRESSING TELFA 3X8 NADH (GAUZE/BANDAGES/DRESSINGS) IMPLANT
SCRUB TECHNI CARE SURGICAL (MISCELLANEOUS) IMPLANT
SHEET MEDIUM DRAPE 40X70 STRL (DRAPES) ×3 IMPLANT
SLEEVE SCD COMPRESS KNEE MED (MISCELLANEOUS) IMPLANT
SOLUTION BUTLER CLEAR DIP (MISCELLANEOUS) ×4 IMPLANT
SPLINT NASAL DOYLE BI-VL (GAUZE/BANDAGES/DRESSINGS) ×4 IMPLANT
SPONGE GAUZE 2X2 8PLY STRL LF (GAUZE/BANDAGES/DRESSINGS) ×4 IMPLANT
SPONGE NEURO XRAY DETECT 1X3 (DISPOSABLE) ×4 IMPLANT
SUT CHROMIC 4 0 P 3 18 (SUTURE) ×4 IMPLANT
SUT PLAIN 4 0 ~~LOC~~ 1 (SUTURE) ×4 IMPLANT
SUT PROLENE 3 0 PS 2 (SUTURE) ×4 IMPLANT
SUT VIC AB 4-0 P-3 18XBRD (SUTURE) IMPLANT
SUT VIC AB 4-0 P3 18 (SUTURE)
SYR CONTROL 10ML LL (SYRINGE) IMPLANT
TOWEL OR 17X24 6PK STRL BLUE (TOWEL DISPOSABLE) ×4 IMPLANT
TUBE CONNECTING 20X1/4 (TUBING) ×4 IMPLANT
TUBE SALEM SUMP 12R W/ARV (TUBING) IMPLANT
TUBE SALEM SUMP 16 FR W/ARV (TUBING) ×4 IMPLANT
YANKAUER SUCT BULB TIP NO VENT (SUCTIONS) ×4 IMPLANT

## 2013-04-12 NOTE — Anesthesia Procedure Notes (Signed)
Procedure Name: Intubation Date/Time: 04/12/2013 10:18 AM Performed by: Meyer Russel Pre-anesthesia Checklist: Patient identified, Emergency Drugs available, Suction available and Patient being monitored Patient Re-evaluated:Patient Re-evaluated prior to inductionOxygen Delivery Method: Circle System Utilized Preoxygenation: Pre-oxygenation with 100% oxygen Intubation Type: IV induction Ventilation: Mask ventilation without difficulty Laryngoscope Size: Miller and 2 Grade View: Grade I Tube type: Oral Tube size: 7.0 mm Number of attempts: 1 Airway Equipment and Method: stylet Placement Confirmation: ETT inserted through vocal cords under direct vision,  positive ETCO2 and breath sounds checked- equal and bilateral Secured at: 21 cm Tube secured with: Tape Dental Injury: Teeth and Oropharynx as per pre-operative assessment

## 2013-04-12 NOTE — Anesthesia Postprocedure Evaluation (Signed)
  Anesthesia Post-op Note  Patient: Tiffany Barry  Procedure(s) Performed: Procedure(s) (LRB): SEPTOPLASTY (N/A) BILATERAL TURBINATE RESECTION (Bilateral) LEFT CONCHA BULLOSA RESECTION (Left)  Patient Location: PACU  Anesthesia Type: General  Level of Consciousness: awake and alert   Airway and Oxygen Therapy: Patient Spontanous Breathing  Post-op Pain: mild  Post-op Assessment: Post-op Vital signs reviewed, Patient's Cardiovascular Status Stable, Respiratory Function Stable, Patent Airway and No signs of Nausea or vomiting  Last Vitals:  Filed Vitals:   04/12/13 1145  BP: 94/75  Pulse: 93  Temp:   Resp: 15    Post-op Vital Signs: stable   Complications: No apparent anesthesia complications. Pt c/o bil "eye burning". This is not new to her. States that this is a continual problem associated with sinus drainage.

## 2013-04-12 NOTE — Transfer of Care (Signed)
Immediate Anesthesia Transfer of Care Note  Patient: Tiffany Barry  Procedure(s) Performed: Procedure(s): SEPTOPLASTY (N/A) BILATERAL TURBINATE RESECTION (Bilateral) LEFT CONCHA BULLOSA RESECTION (Left)  Patient Location: PACU  Anesthesia Type:General  Level of Consciousness: awake, alert  and oriented  Airway & Oxygen Therapy: Patient Spontanous Breathing and Patient connected to face mask oxygen  Post-op Assessment: Report given to PACU RN, Post -op Vital signs reviewed and stable and Patient moving all extremities  Post vital signs: Reviewed and stable  Complications: No apparent anesthesia complications

## 2013-04-12 NOTE — H&P (Signed)
H&P Update  Pt's original H&P dated 04/07/13 reviewed and placed in chart (to be scanned).  I personally examined the patient today.  No change in health. Proceed with septoplasty, left concha bullosa resection, and bilateral partial inferior turbinate resection.

## 2013-04-12 NOTE — Op Note (Signed)
DATE OF PROCEDURE: 04/12/2013  OPERATIVE REPORT   SURGEON: Newman Pies, MD   PREOPERATIVE DIAGNOSES:  1. Severe nasal septal deviation.  2. Bilateral inferior turbinate hypertrophy.  3. Chronic nasal obstruction. 4. Large left concha bullosa.   POSTOPERATIVE DIAGNOSES:  1. Severe nasal septal deviation.  2. Bilateral inferior turbinate hypertrophy.  3. Chronic nasal obstruction. 4. Large left concha bullosa.   PROCEDURE PERFORMED:  1. Septoplasty.  2. Bilateral partial inferior turbinate resection.  3. Endoscopic left concha bullosa resection.  ANESTHESIA: General endotracheal tube anesthesia.   COMPLICATIONS: None.   ESTIMATED BLOOD LOSS: 150 mL.   INDICATION FOR PROCEDURE: Tiffany Barry is a 42 y.o. female with a history of chronic nasal obstruction. The patient was  treated with antihistamine, decongestant, steroid nasal spray, and systemic steroids. However, the patient continues to be symptomatic. On examination, the patient was noted to have bilateral severe inferior turbinate hypertrophy and significant nasal septal deviation, causing significant nasal obstruction. On her CT scan, she was also noted to have a large left concha bullosa. Based on the above findings, the decision was made for the patient to undergo the above-stated procedures. The risks, benefits, alternatives, and details of the procedure were discussed with the patient. Questions were invited and answered. Informed consent was obtained.   DESCRIPTION OF PROCEDURE: The patient was taken to the operating room and placed supine on the operating table. General endotracheal tube anesthesia was administered by the anesthesiologist. The patient was positioned, and prepped and draped in the standard fashion for nasal surgery. Pledgets soaked with Afrin were placed in both nasal cavities for decongestion. The pledgets were subsequently removed. The above mentioned severe septal deviation was again noted. 1% lidocaine with  1:100,000 epinephrine was injected onto the nasal septum bilaterally. A hemitransfixion incision was made on the left side. The mucosal flap was carefully elevated on the left side. A cartilaginous incision was made 1 cm superior to the caudal margin of the nasal septum. Mucosal flap was also elevated on the right side in the similar fashion. It should be noted that due to the severe septal deviation, the deviated portion of the cartilaginous and bony septum had to be removed in piecemeal fashion. Once the deviated portions were removed, a straight midline septum was achieved. The septum was then quilted with 4-0 plain gut sutures. The hemitransfixion incision was closed with interrupted 4-0 chromic sutures.   With a 0 endoscope, the left nasal cavity was examined. A large left middle turbinate was noted. This was consistent with large concha bullosa. Using a sickle knife, the lateral aspect of the middle turbinate was carefully resected.  Hemostasis was achieved with the suction electrocautery.  Doyle splints were applied to the septum.   Prior to the [facility] splint application, the inferior one half of both hypertrophied inferior turbinate was crossclamped with a Kelly clamp. The inferior one half of each inferior turbinate was then resected with a pair of cross cutting scissors. Hemostasis was achieved with a suction cautery device.   The care of the patient was turned over to the anesthesiologist. The patient was awakened from anesthesia without difficulty. The patient was extubated and transferred to the recovery room in good condition.   OPERATIVE FINDINGS: Severe nasal septal deviation, large left concha bullosa, and bilateral inferior turbinate hypertrophy.   SPECIMEN: None.   FOLLOWUP CARE: The patient be discharged home once she is awake and alert. The patient will be placed on Percocet 1-2 tablets p.o. q.6 hours p.r.n. pain,  and amoxicillin 875 mg p.o. b.i.d. for 5 days. The patient will follow  up in my office in approximately 1 week for splint removal.   Biff Rutigliano Philomena Doheny, MD

## 2013-04-12 NOTE — Anesthesia Preprocedure Evaluation (Signed)
Anesthesia Evaluation  Patient identified by MRN, date of birth, ID band Patient awake    Reviewed: Allergy & Precautions, H&P , NPO status , Patient's Chart, lab work & pertinent test results  History of Anesthesia Complications Negative for: history of anesthetic complications  Airway Mallampati: I TM Distance: >3 FB Neck ROM: Full    Dental  (+) Teeth Intact   Pulmonary neg pulmonary ROS, asthma (Used inhaler this AM) , Current Smoker,  breath sounds clear to auscultation        Cardiovascular negative cardio ROS  Rhythm:Regular Rate:Normal     Neuro/Psych negative neurological ROS  negative psych ROS   GI/Hepatic negative GI ROS, Neg liver ROS, GERD-  Medicated and Controlled,  Endo/Other  negative endocrine ROS  Renal/GU negative Renal ROS  negative genitourinary   Musculoskeletal negative musculoskeletal ROS (+)   Abdominal   Peds negative pediatric ROS (+)  Hematology negative hematology ROS (+)   Anesthesia Other Findings Upper front cap  Reproductive/Obstetrics negative OB ROS                           Anesthesia Physical  Anesthesia Plan  ASA: II  Anesthesia Plan: General   Post-op Pain Management:    Induction: Intravenous  Airway Management Planned: Oral ETT  Additional Equipment:   Intra-op Plan:   Post-operative Plan: Extubation in OR  Informed Consent: I have reviewed the patients History and Physical, chart, labs and discussed the procedure including the risks, benefits and alternatives for the proposed anesthesia with the patient or authorized representative who has indicated his/her understanding and acceptance.   Dental advisory given  Plan Discussed with: CRNA  Anesthesia Plan Comments:         Anesthesia Quick Evaluation

## 2013-04-13 ENCOUNTER — Encounter (HOSPITAL_BASED_OUTPATIENT_CLINIC_OR_DEPARTMENT_OTHER): Payer: Self-pay | Admitting: Otolaryngology

## 2013-04-28 ENCOUNTER — Ambulatory Visit (INDEPENDENT_AMBULATORY_CARE_PROVIDER_SITE_OTHER): Payer: Medicaid Other | Admitting: Otolaryngology

## 2013-05-12 ENCOUNTER — Other Ambulatory Visit: Payer: Self-pay | Admitting: Obstetrics and Gynecology

## 2013-08-30 ENCOUNTER — Encounter (HOSPITAL_COMMUNITY): Payer: Self-pay

## 2013-08-30 ENCOUNTER — Emergency Department (HOSPITAL_COMMUNITY)
Admission: EM | Admit: 2013-08-30 | Discharge: 2013-08-30 | Disposition: A | Discharge status: Home / Self Care | Payer: Medicaid Other | Attending: Emergency Medicine | Admitting: Emergency Medicine

## 2013-08-30 ENCOUNTER — Emergency Department (HOSPITAL_COMMUNITY): Payer: Medicaid Other

## 2013-08-30 DIAGNOSIS — M545 Low back pain, unspecified: Secondary | ICD-10-CM | POA: Insufficient documentation

## 2013-08-30 DIAGNOSIS — Z79899 Other long term (current) drug therapy: Secondary | ICD-10-CM | POA: Insufficient documentation

## 2013-08-30 DIAGNOSIS — M5416 Radiculopathy, lumbar region: Secondary | ICD-10-CM

## 2013-08-30 DIAGNOSIS — J45909 Unspecified asthma, uncomplicated: Secondary | ICD-10-CM | POA: Insufficient documentation

## 2013-08-30 DIAGNOSIS — G8929 Other chronic pain: Secondary | ICD-10-CM | POA: Insufficient documentation

## 2013-08-30 DIAGNOSIS — F17200 Nicotine dependence, unspecified, uncomplicated: Secondary | ICD-10-CM | POA: Insufficient documentation

## 2013-08-30 DIAGNOSIS — M79609 Pain in unspecified limb: Secondary | ICD-10-CM | POA: Insufficient documentation

## 2013-08-30 DIAGNOSIS — IMO0002 Reserved for concepts with insufficient information to code with codable children: Secondary | ICD-10-CM | POA: Insufficient documentation

## 2013-08-30 DIAGNOSIS — Z8719 Personal history of other diseases of the digestive system: Secondary | ICD-10-CM | POA: Insufficient documentation

## 2013-08-30 MED ORDER — IBUPROFEN 800 MG PO TABS
800.0000 mg | ORAL_TABLET | Freq: Once | ORAL | Status: AC
  Administered 2013-08-30: 800 mg via ORAL
  Filled 2013-08-30: qty 1

## 2013-08-30 MED ORDER — OXYCODONE-ACETAMINOPHEN 5-325 MG PO TABS: 1.0000 | ORAL_TABLET | ORAL | Status: DC | PRN

## 2013-08-30 MED ORDER — PREDNISONE 10 MG PO TABS: ORAL_TABLET | ORAL | Status: DC

## 2013-08-30 MED ORDER — CYCLOBENZAPRINE HCL 10 MG PO TABS: 10.0000 mg | ORAL_TABLET | Freq: Three times a day (TID) | ORAL | Status: DC | PRN

## 2013-08-30 MED ORDER — OXYCODONE-ACETAMINOPHEN 5-325 MG PO TABS
1.0000 | ORAL_TABLET | Freq: Once | ORAL | Status: AC
  Administered 2013-08-30: 1 via ORAL
  Filled 2013-08-30: qty 1
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
## 2013-08-31 NOTE — ED Provider Notes (Signed)
Medical screening examination/treatment/procedure(s) were performed by non-physician practitioner and as supervising physician I was immediately available for consultation/collaboration.   Laray Anger, DO 08/31/13 2329

## 2013-09-29 ENCOUNTER — Ambulatory Visit (INDEPENDENT_AMBULATORY_CARE_PROVIDER_SITE_OTHER): Payer: Medicaid Other | Admitting: Orthopedic Surgery

## 2013-09-29 ENCOUNTER — Encounter: Payer: Self-pay | Admitting: Orthopedic Surgery

## 2013-09-29 VITALS — BP 114/73 | Ht 62.0 in | Wt 116.0 lb

## 2013-09-29 DIAGNOSIS — M5126 Other intervertebral disc displacement, lumbar region: Secondary | ICD-10-CM

## 2013-09-29 MED ORDER — OXYCODONE-ACETAMINOPHEN 5-325 MG PO TABS: 1.0000 | ORAL_TABLET | ORAL | Status: DC | PRN

## 2013-09-29 MED ORDER — CYCLOBENZAPRINE HCL 10 MG PO TABS: 10.0000 mg | ORAL_TABLET | Freq: Three times a day (TID) | ORAL | Status: DC | PRN

## 2013-09-29 MED ORDER — PREDNISONE (PAK) 10 MG PO TABS: 10.0000 mg | ORAL_TABLET | Freq: Three times a day (TID) | ORAL | Status: DC

## 2013-09-29 NOTE — Patient Instructions (Addendum)
MRI L SPINE   USE HEAT , REST   TAKE MEDICATION AS ORDERED   You have been scheduled for an MRI scan.  Your insurance company requires a precertification prior to scheduling the MRI.  If the MRI scan is not approved we will let you know and make further treatment recommendations according to your insurance's guidelines.   We will call you with the results  Herniated Disk The bones of your spinal column (vertebrae) protect your spinal cord and nerves that go into your arms and legs. The vertebrae are separated by disks that cushion the spinal column and put space between your vertebrae. This allows movement between the vertebrae, which allows you to bend, rotate, and move your body from side to side. Sometimes, the disks move out of place (herniate) or break open (rupture) from injury or strain. The most common area for a disk herniation is in the lower back (lumbar area). Sometimes herniation occurs in the neck (cervical) disks.  CAUSES  As we grow older, the strong, fibrous cords that connect the vertebrae and support and surround the disks (ligaments) start to weaken. A strain on the back may cause a break in the disk ligaments. RISK FACTORS Herniated disks occur most often in men who are aged 18 years to 35 years, usually after strenuous activity. Other risk factors include conditions present at birth (congenital) that affect the size of the lumbar spinal canal. Additionally, a narrowing of the areas where the nerves exit the spinal canal can occur as you age. SYMPTOMS  Symptoms of a herniated disk vary. You may have weakness in certain muscles. This weakness can include difficulty lifting your leg or arm, difficulty standing on your toes on one side, or difficulty squeezing tightly with one of your hands. You may have numbness. You may feel a mild tingling, dull ache, or a burning or pulsating pain. In some cases, the pain is severe enough that you are unable to move. The pain most often  occurs on one side of the body. The pain often starts slowly. It may get worse:  After you sit or stand.  At night.  When you sneeze, cough, or laugh.  When you bend backwards or walk more than a few yards. The pain, numbness, or weakness will often go away or improve a lot over a period of weeks to months. Herniated lumbar disk Symptoms of a herniated lumbar disk may include sharp pain in one part of your leg, hip, or buttocks and numbness in other parts. You also may feel pain or numbness on the back of your calf or the top or sole of your foot. The same leg also may feel weak. Herniated cervical disk Symptoms of a herniated cervical disk may include pain when you move your neck, deep pain near or over your shoulder blade, or pain that moves to your upper arm, forearm, or fingers. DIAGNOSIS  To diagnose a herniated disk, your caregiver will perform a physical exam. Your caregiver also may perform diagnostic tests to see your disk or to test the reaction of your muscles and the function of your nerves. During the physical exam, your caregiver may ask you to:  Sit, stand, and walk. While you walk, your caregiver may ask you to try walking on your toes and then your heels.  Bend forward, backward, and sideways.  Raise your shoulders, elbow, wrist, and fingers and check your strength during these tasks. Your caregiver will check for:  Numbness or loss of  feeling.  Muscle reflexes, which may be slower or missing.  Muscle strength, which may be weaker.  Posture or the way your spine curves. Diagnostic tests that may be done include:  A spinal X-ray exam to rule out other causes of back pain.  Magnetic resonance imaging (MRI) or computed tomography (CT) scan, which will show if the herniated disk is pressing on your spinal canal.  Electromyography. This is sometimes used to identify the specific area of nerve involvement. TREATMENT  Initial treatment for a herniated disk is a short  period of rest with medicines for pain. Pain medicines can include nonsteroidal anti-inflammatory medicines (NSAIDs), muscle relaxants for back spasms, and (rarely) narcotic pain medicine for severe pain that does not respond to NSAID use. Bed rest is often limited to 1 or 2 days at the most because prolonged rest can delay recovery. When the herniation involves the lower back, sitting should be avoided as much as possible because sitting increases pressure on the ruptured disk. Sometimes a soft neck collar will be prescribed for a few days to weeks to help support your neck in the case of a cervical herniation. Physical therapy is often prescribed for patients with disk disease. Physical therapists will teach you how to properly lift, dress, walk, and perform other activities. They will work on strengthening the muscles that help support your spine. In some cases, physical therapy alone is not enough to treat a herniated disk. Steroid injections along the involved nerve root may be needed to help control pain. The steroid is injected in the area of the herniated disk and helps by reducing swelling around the disk. Sometimes surgery is the best option to treat a herniated disk.  SEEK IMMEDIATE MEDICAL CARE IF:   You have numbness, tingling, weakness, or problems with the use of your arms or legs.  You have severe headaches that are not relieved with the use of medicines.  You notice a change in your bowel or bladder control.  You have increasing pain in any areas of your body.  You experience shortness of breath, dizziness, or fainting. MAKE SURE YOU:   Understand these instructions.  Will watch your condition.  Will get help right away if you are not doing well or get worse. Document Released: 11/28/2000 Document Revised: 02/23/2012 Document Reviewed: 07/04/2011 [facility] Patient Information 2014 [place], [place].   Attention: You have  indicated that you are still smoking.  If you are  interested in quitting please see your primary care physician regarding measures you can take to improve your health and stop smoking.   Smoking Cessation Quitting smoking is important to your health and has many advantages. However, it is not always easy to quit since nicotine is a very addictive drug. Often times, people try 3 times or more before being able to quit. This document explains the best ways for you to prepare to quit smoking. Quitting takes hard work and a lot of effort, but you can do it. ADVANTAGES OF QUITTING SMOKING  You will live longer, feel better, and live better.  Your body will feel the impact of quitting smoking almost immediately.  Within 20 minutes, blood pressure decreases. Your pulse returns to its normal level.  After 8 hours, carbon monoxide levels in the blood return to normal. Your oxygen level increases.  After 24 hours, the chance of having a heart attack starts to decrease. Your breath, hair, and body stop smelling like smoke.  After 48 hours, damaged nerve endings begin to  recover. Your sense of taste and smell improve.  After 72 hours, the body is virtually free of nicotine. Your bronchial tubes relax and breathing becomes easier.  After 2 to 12 weeks, lungs can hold more air. Exercise becomes easier and circulation improves.  The risk of having a heart attack, stroke, cancer, or lung disease is greatly reduced.  After 1 year, the risk of coronary heart disease is cut in half.  After 5 years, the risk of stroke falls to the same as a nonsmoker.  After 10 years, the risk of lung cancer is cut in half and the risk of other cancers decreases significantly.  After 15 years, the risk of coronary heart disease drops, usually to the level of a nonsmoker.  If you are pregnant, quitting smoking will improve your chances of having a healthy baby.  The people you live with, especially any children, will be healthier.  You will have extra money to spend  on things other than cigarettes. QUESTIONS TO THINK ABOUT BEFORE ATTEMPTING TO QUIT You may want to talk about your answers with your caregiver.  Why do you want to quit?  If you tried to quit in the past, what helped and what did not?  What will be the most difficult situations for you after you quit? How will you plan to handle them?  Who can help you through the tough times? Your family? Friends? A caregiver?  What pleasures do you get from smoking? What ways can you still get pleasure if you quit? Here are some questions to ask your caregiver:  How can you help me to be successful at quitting?  What medicine do you think would be best for me and how should I take it?  What should I do if I need more help?  What is smoking withdrawal like? How can I get information on withdrawal? GET READY  Set a quit date.  Change your environment by getting rid of all cigarettes, ashtrays, matches, and lighters in your home, car, or work. Do not let people smoke in your home.  Review your past attempts to quit. Think about what worked and what did not. GET SUPPORT AND ENCOURAGEMENT You have a better chance of being successful if you have help. You can get support in many ways.  Tell your family, friends, and co-workers that you are going to quit and need their support. Ask them not to smoke around you.  Get individual, group, or telephone counseling and support. Programs are available at [employer] and health centers. Call your local health department for information about programs in your area.  Spiritual beliefs and practices may help some smokers quit.  Download a "quit meter" on your computer to keep track of quit statistics, such as how long you have gone without smoking, cigarettes not smoked, and money saved.  Get a self-help book about quitting smoking and staying off of tobacco. LEARN NEW SKILLS AND BEHAVIORS  Distract yourself from urges to smoke. Talk to someone, go for  a walk, or occupy your time with a task.  Change your normal routine. Take a different route to work. Drink tea instead of coffee. Eat breakfast in a different place.  Reduce your stress. Take a hot bath, exercise, or read a book.  Plan something enjoyable to do every day. Reward yourself for not smoking.  Explore interactive web-based programs that specialize in helping you quit. GET MEDICINE AND USE IT CORRECTLY Medicines can help you stop smoking and  decrease the urge to smoke. Combining medicine with the above behavioral methods and support can greatly increase your chances of successfully quitting smoking.  Nicotine replacement therapy helps deliver nicotine to your body without the negative effects and risks of smoking. Nicotine replacement therapy includes nicotine gum, lozenges, inhalers, nasal sprays, and skin patches. Some may be available over-the-counter and others require a prescription.  Antidepressant medicine helps people abstain from smoking, but how this works is unknown. This medicine is available by prescription.  Nicotinic receptor partial agonist medicine simulates the effect of nicotine in your brain. This medicine is available by prescription. Ask your caregiver for advice about which medicines to use and how to use them based on your health history. Your caregiver will tell you what side effects to look out for if you choose to be on a medicine or therapy. Carefully read the information on the package. Do not use any other product containing nicotine while using a nicotine replacement product.  RELAPSE OR DIFFICULT SITUATIONS Most relapses occur within the first 3 months after quitting. Do not be discouraged if you start smoking again. Remember, most people try several times before finally quitting. You may have symptoms of withdrawal because your body is used to nicotine. You may crave cigarettes, be irritable, feel very hungry, cough often, get headaches, or have  difficulty concentrating. The withdrawal symptoms are only temporary. They are strongest when you first quit, but they will go away within 10 14 days. To reduce the chances of relapse, try to:  Avoid drinking alcohol. Drinking lowers your chances of successfully quitting.  Reduce the amount of caffeine you consume. Once you quit smoking, the amount of caffeine in your body increases and can give you symptoms, such as a rapid heartbeat, sweating, and anxiety.  Avoid smokers because they can make you want to smoke.  Do not let weight gain distract you. Many smokers will gain weight when they quit, usually less than 10 pounds. Eat a healthy diet and stay active. You can always lose the weight gained after you quit.  Find ways to improve your mood other than smoking. FOR MORE INFORMATION  www.smokefree.gov  Document Released: 11/25/2001 Document Revised: 06/01/2012 Document Reviewed: 03/11/2012 [facility] Patient Information 2014 [place], [place].
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
## 2013-10-12 ENCOUNTER — Other Ambulatory Visit: Payer: Self-pay | Admitting: *Deleted

## 2013-10-12 DIAGNOSIS — IMO0002 Reserved for concepts with insufficient information to code with codable children: Secondary | ICD-10-CM

## 2013-10-13 DIAGNOSIS — Z029 Encounter for administrative examinations, unspecified: Secondary | ICD-10-CM

## 2013-10-14 ENCOUNTER — Telehealth: Payer: Self-pay | Admitting: *Deleted

## 2013-10-14 NOTE — Telephone Encounter (Signed)
MRI L Spine CPT [phone] approved - Authr # [id number] effective 10/12/13 expires 11/11/13 per fax. NPI [id number] was obtained per [facility] with [facility]. Scheduled for 10/18/13 at 12:00 pm. Follow up appointment made for 10/24/13 at 9:00 am. Patient aware.

## 2013-10-17 ENCOUNTER — Telehealth: Payer: Self-pay | Admitting: *Deleted

## 2013-10-17 NOTE — Telephone Encounter (Signed)
Laney Potash, with [facility], called and stated patient's medicaid is good for the month of November.

## 2013-10-18 ENCOUNTER — Ambulatory Visit (HOSPITAL_COMMUNITY)
Admission: RE | Admit: 2013-10-18 | Discharge: 2013-10-18 | Disposition: A | Discharge status: Home / Self Care | Payer: Medicaid Other | Source: Ambulatory Visit | Attending: Orthopedic Surgery | Admitting: Orthopedic Surgery

## 2013-10-18 DIAGNOSIS — IMO0002 Reserved for concepts with insufficient information to code with codable children: Secondary | ICD-10-CM

## 2013-10-18 DIAGNOSIS — M545 Low back pain, unspecified: Secondary | ICD-10-CM | POA: Insufficient documentation

## 2013-10-18 DIAGNOSIS — M5126 Other intervertebral disc displacement, lumbar region: Secondary | ICD-10-CM | POA: Insufficient documentation

## 2013-10-20 ENCOUNTER — Other Ambulatory Visit: Payer: Self-pay

## 2013-10-24 ENCOUNTER — Encounter: Payer: Self-pay | Admitting: Orthopedic Surgery

## 2013-10-24 ENCOUNTER — Ambulatory Visit (INDEPENDENT_AMBULATORY_CARE_PROVIDER_SITE_OTHER): Payer: Medicaid Other | Admitting: Orthopedic Surgery

## 2013-10-24 VITALS — BP 119/82 | Ht 62.0 in | Wt 116.0 lb

## 2013-10-24 DIAGNOSIS — M5137 Other intervertebral disc degeneration, lumbosacral region: Secondary | ICD-10-CM

## 2013-10-24 DIAGNOSIS — M51369 Other intervertebral disc degeneration, lumbar region without mention of lumbar back pain or lower extremity pain: Secondary | ICD-10-CM | POA: Insufficient documentation

## 2013-10-24 DIAGNOSIS — M5136 Other intervertebral disc degeneration, lumbar region: Secondary | ICD-10-CM | POA: Insufficient documentation

## 2013-10-24 NOTE — Progress Notes (Signed)
Patient ID: Tiffany Barry, female   DOB: November 01, 1971, 42 y.o.   MRN: [id number]  Chief Complaint  Patient presents with  . Follow-up    Review MRI results of L-spine.    L4-L5: Mild broad-based disk bulge. Mild bilateral facet arthropathy. No evidence of neural foraminal stenosis. No central canal stenosis.   L5-S1: Mild broad-based disk bulge. Mild bilateral facet arthropathy. No evidence of neural foraminal stenosis. No central canal stenosis.   IMPRESSION: Mild broad-based disc bulges at L4-5 and L5-S1.     Electronically Signed   By: Elige Ko   On: 10/18/2013 13:19 I reviewed the patient's MRI findings and reviewed the findings with her. She does not have any evidence of surgical disease. She will be referred to a specialist to evaluate and treat plus or minus epidurals as they see fit.

## 2013-10-24 NOTE — Patient Instructions (Signed)
YOU HAVE DEGENERATIVE DISC DISEASE AND WILL BE SENT TO A SPECIALIST FOR FURTHER CARE

## 2013-11-02 ENCOUNTER — Telehealth: Payer: Self-pay | Admitting: *Deleted

## 2013-11-02 NOTE — Telephone Encounter (Signed)
Patient needed a referral to a neurosurgeon, however, she has medicaid [place] access [facility] Dept.) and they have to make the referral. I faxed office notes from our office to [facility] back on 10/25/13. I called patient today to follow up, and she had an appointment today with a neurosurgeon.

## 2013-11-03 ENCOUNTER — Ambulatory Visit (INDEPENDENT_AMBULATORY_CARE_PROVIDER_SITE_OTHER): Payer: Medicaid Other | Admitting: Otolaryngology

## 2013-11-03 DIAGNOSIS — J310 Chronic rhinitis: Secondary | ICD-10-CM

## 2013-11-08 ENCOUNTER — Ambulatory Visit (INDEPENDENT_AMBULATORY_CARE_PROVIDER_SITE_OTHER): Payer: Medicaid Other | Admitting: Obstetrics & Gynecology

## 2013-11-08 ENCOUNTER — Encounter: Payer: Self-pay | Admitting: Obstetrics & Gynecology

## 2013-11-08 VITALS — BP 120/80 | Ht 61.0 in | Wt 117.0 lb

## 2013-11-08 DIAGNOSIS — Z01419 Encounter for gynecological examination (general) (routine) without abnormal findings: Secondary | ICD-10-CM

## 2013-11-08 DIAGNOSIS — Z0000 Encounter for general adult medical examination without abnormal findings: Secondary | ICD-10-CM
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
## 2013-11-28 ENCOUNTER — Other Ambulatory Visit: Payer: Self-pay | Admitting: Obstetrics and Gynecology

## 2014-02-02 ENCOUNTER — Ambulatory Visit (INDEPENDENT_AMBULATORY_CARE_PROVIDER_SITE_OTHER): Payer: Medicaid Other | Admitting: Otolaryngology

## 2014-02-02 DIAGNOSIS — J0100 Acute maxillary sinusitis, unspecified: Secondary | ICD-10-CM

## 2014-02-02 DIAGNOSIS — J310 Chronic rhinitis: Secondary | ICD-10-CM

## 2014-02-16 ENCOUNTER — Ambulatory Visit (INDEPENDENT_AMBULATORY_CARE_PROVIDER_SITE_OTHER): Payer: Medicaid Other | Admitting: Otolaryngology

## 2014-02-16 DIAGNOSIS — J0100 Acute maxillary sinusitis, unspecified: Secondary | ICD-10-CM

## 2014-02-16 DIAGNOSIS — J310 Chronic rhinitis: Secondary | ICD-10-CM

## 2014-04-02 ENCOUNTER — Other Ambulatory Visit: Payer: Self-pay | Admitting: Obstetrics and Gynecology

## 2014-04-12 ENCOUNTER — Other Ambulatory Visit: Payer: Self-pay | Admitting: Neurosurgery

## 2014-04-13 ENCOUNTER — Encounter (HOSPITAL_COMMUNITY): Payer: Self-pay | Admitting: Pharmacy Technician

## 2014-04-19 NOTE — Pre-Procedure Instructions (Signed)
Tiffany Barry  04/19/2014   Your procedure is scheduled on:  Tues, May 12 @ 7:30 AM  [facility]  at 5:30 AM.  Call this number if you have problems the morning of surgery: [phone]   Remember:   Do not eat food or drink liquids after midnight.   Take these medicines the morning of surgery with A SIP OF WATER: Albuterol<Bring Your Inhaler With You>              No Goody's,BC's,Aleve,Aspirin,Ibuprofen,Fish Oil,or any Herbal Medications   Do not wear jewelry, make-up or nail polish.  Do not wear lotions, powders, or perfumes. You may wear deodorant.  Do not shave 48 hours prior to surgery.  Do not bring valuables to the hospital.  [facility] is not responsible                  for any belongings or valuables.               Contacts, dentures or bridgework may not be worn into surgery.  Leave suitcase in the car. After surgery it may be brought to your room.  For patients admitted to the hospital, discharge time is determined by your                treatment team.               Patients discharged the day of surgery will not be allowed to drive  home.    Special Instructions:  [place] - Preparing for Surgery  Before surgery, you can play an important role.  Because skin is not sterile, your skin needs to be as free of germs as possible.  You can reduce the number of germs on you skin by washing with CHG (chlorahexidine gluconate) soap before surgery.  CHG is an antiseptic cleaner which kills germs and bonds with the skin to continue killing germs even after washing.  Please DO NOT use if you have an allergy to CHG or antibacterial soaps.  If your skin becomes reddened/irritated stop using the CHG and inform your nurse when you arrive at Short Stay.  Do not shave (including legs and underarms) for at least 48 hours prior to the first CHG shower.  You may shave your face.  Please follow these instructions carefully:   1.  Shower with CHG Soap the night before  surgery and the                                morning of Surgery.  2.  If you choose to wash your hair, wash your hair first as usual with your       normal shampoo.  3.  After you shampoo, rinse your hair and body thoroughly to remove the                      Shampoo.  4.  Use CHG as you would any other liquid soap.  You can apply chg directly       to the skin and wash gently with scrungie or a clean washcloth.  5.  Apply the CHG Soap to your body ONLY FROM THE NECK DOWN.        Do not use on open wounds or open sores.  Avoid contact with your eyes,       ears, mouth and genitals (private  parts).  Wash genitals (private parts)       with your normal soap.  6.  Wash thoroughly, paying special attention to the area where your surgery        will be performed.  7.  Thoroughly rinse your body with warm water from the neck down.  8.  DO NOT shower/wash with your normal soap after using and rinsing off       the CHG Soap.  9.  Pat yourself dry with a clean towel.            10.  Wear clean pajamas.            11.  Place clean sheets on your bed the night of your first shower and do not        sleep with pets.  Day of Surgery  Do not apply any lotions/deoderants the morning of surgery.  Please wear clean clothes to the hospital/surgery center.     Please read over the following fact sheets that you were given: Pain Booklet, Coughing and Deep Breathing, MRSA Information and Surgical Site Infection Prevention

## 2014-04-20 ENCOUNTER — Encounter (HOSPITAL_COMMUNITY)
Admission: RE | Admit: 2014-04-20 | Discharge: 2014-04-20 | Disposition: A | Discharge status: Home / Self Care | Payer: Medicaid Other | Source: Ambulatory Visit | Attending: Neurosurgery | Admitting: Neurosurgery

## 2014-04-20 ENCOUNTER — Ambulatory Visit (HOSPITAL_COMMUNITY)
Admission: RE | Admit: 2014-04-20 | Discharge: 2014-04-20 | Disposition: A | Discharge status: Home / Self Care | Payer: Medicaid Other | Source: Ambulatory Visit | Attending: Anesthesiology | Admitting: Anesthesiology

## 2014-04-20 ENCOUNTER — Encounter (HOSPITAL_COMMUNITY): Payer: Self-pay

## 2014-04-20 DIAGNOSIS — Z01812 Encounter for preprocedural laboratory examination: Secondary | ICD-10-CM | POA: Insufficient documentation

## 2014-04-20 DIAGNOSIS — Z01818 Encounter for other preprocedural examination: Secondary | ICD-10-CM | POA: Insufficient documentation

## 2014-04-20 HISTORY — DX: Cardiac murmur, unspecified: R01.1

## 2014-04-20 HISTORY — DX: Dizziness and giddiness: R42

## 2014-04-20 HISTORY — DX: Unspecified osteoarthritis, unspecified site: M19.90

## 2014-04-20 HISTORY — DX: Weakness: R53.1

## 2014-04-20 HISTORY — DX: Diarrhea, unspecified: R19.7

## 2014-04-20 HISTORY — DX: Headache: R51

## 2014-04-20 LAB — CBC
HEMATOCRIT: 38.5 % (ref 36.0–46.0)
Hemoglobin: 13.2 g/dL (ref 12.0–15.0)
MCH: 33.4 pg (ref 26.0–34.0)
MCHC: 34.3 g/dL (ref 30.0–36.0)
MCV: 97.5 fL (ref 78.0–100.0)
Platelets: 255 10*3/uL (ref 150–400)
RBC: 3.95 MIL/uL (ref 3.87–5.11)
RDW: 12.3 % (ref 11.5–15.5)
WBC: 8.6 10*3/uL (ref 4.0–10.5)

## 2014-04-20 LAB — BASIC METABOLIC PANEL
BUN: 7 mg/dL (ref 6–23)
CO2: 25 mEq/L (ref 19–32)
Calcium: 8.7 mg/dL (ref 8.4–10.5)
Chloride: 104 mEq/L (ref 96–112)
Creatinine, Ser: 0.62 mg/dL (ref 0.50–1.10)
GFR calc Af Amer: >90 mL/min (ref >90)
GLUCOSE: 87 mg/dL (ref 70–99)
Potassium: 4.2 mEq/L (ref 3.7–5.3)
Sodium: 141 mEq/L (ref 137–147)

## 2014-04-20 LAB — SURGICAL PCR SCREEN
MRSA, PCR: NEGATIVE
Staphylococcus aureus: NEGATIVE

## 2014-04-20 NOTE — Progress Notes (Signed)
Pt doesn't have a cardiologist  Denies ever having an echo/stress/heart cath    Se[facility]  Denies EKG or CXR in past yr

## 2014-04-21 NOTE — Progress Notes (Signed)
Anesthesia Chart Review:  Patient is a 43 year old female scheduled for SI joint fusion on 04/25/14 by Dr. Gerlene FeeKritzer.  History includes smoking, GERD, heart murmur (not specified), asthma, headaches, arthritis, septoplasty '14, chronic back pain, hysterectomy '12.  BMI is 20.48. For primary care, she is seen by a NP at the [facility].   EKG on 04/20/14 showed: NSR, left posterior fasciciular block, non-specific T wave abnormality. Currently, there are no comparison EKGs available; however, findings look consistent with RA/RL lead reversal including a positive aVR and flat lead II.  Will have EKG repeated on arrival. She has no reported history of CAD/MI/CHF, HTN, DM or obesity. She is a smoker. No CV symptoms were documented from her PAT visit.   Preoperative CXR and labs noted.  Her assigned anesthesiologist can review her repeat EKG on the day of surgery.  If no worrisome findings and she remains asymptomatic from a CV standpoint then I would anticipate that she could proceed as planned.    Tiffany Ochsllison Zelenak, PA-C [facility]/Anesthesiology Phone [phone] 04/21/2014 10:45 AM

## 2014-04-24 MED ORDER — CEFAZOLIN SODIUM-DEXTROSE 2-3 GM-% IV SOLR
2.0000 g | INTRAVENOUS | Status: DC
  Filled 2014-04-24: qty 50

## 2014-04-25 ENCOUNTER — Inpatient Hospital Stay (HOSPITAL_COMMUNITY): Payer: Medicaid Other | Admitting: Certified Registered"

## 2014-04-25 ENCOUNTER — Encounter (HOSPITAL_COMMUNITY)
Admission: RE | Disposition: A | Discharge status: Home / Self Care | Payer: Self-pay | Source: Ambulatory Visit | Attending: Neurosurgery

## 2014-04-25 ENCOUNTER — Inpatient Hospital Stay (HOSPITAL_COMMUNITY)
Admission: RE | Admit: 2014-04-25 | Discharge: 2014-04-25 | DRG: 460 | Disposition: A | Discharge status: Home / Self Care | Payer: Medicaid Other | Source: Ambulatory Visit | Attending: Neurosurgery | Admitting: Neurosurgery

## 2014-04-25 ENCOUNTER — Inpatient Hospital Stay (HOSPITAL_COMMUNITY): Payer: Medicaid Other

## 2014-04-25 ENCOUNTER — Encounter (HOSPITAL_COMMUNITY): Payer: Self-pay | Admitting: Anesthesiology

## 2014-04-25 ENCOUNTER — Encounter (HOSPITAL_COMMUNITY): Payer: Medicaid Other | Admitting: Vascular Surgery

## 2014-04-25 DIAGNOSIS — M533 Sacrococcygeal disorders, not elsewhere classified: Principal | ICD-10-CM | POA: Diagnosis present

## 2014-04-25 DIAGNOSIS — Z8249 Family history of ischemic heart disease and other diseases of the circulatory system: Secondary | ICD-10-CM

## 2014-04-25 DIAGNOSIS — G8929 Other chronic pain: Secondary | ICD-10-CM | POA: Diagnosis present

## 2014-04-25 DIAGNOSIS — J45909 Unspecified asthma, uncomplicated: Secondary | ICD-10-CM | POA: Diagnosis present

## 2014-04-25 DIAGNOSIS — K219 Gastro-esophageal reflux disease without esophagitis: Secondary | ICD-10-CM | POA: Diagnosis present

## 2014-04-25 DIAGNOSIS — F17200 Nicotine dependence, unspecified, uncomplicated: Secondary | ICD-10-CM | POA: Diagnosis present

## 2014-04-25 HISTORY — PX: LUMBAR FUSION: SHX111

## 2014-04-25 SURGERY — LUMBAR FACET FUSION
Anesthesia: General

## 2014-04-25 MED ORDER — ONDANSETRON HCL 4 MG/2ML IJ SOLN
INTRAMUSCULAR | Status: AC
  Filled 2014-04-25: qty 2

## 2014-04-25 MED ORDER — OXYCODONE HCL 5 MG/5ML PO SOLN: 5.0000 mg | Freq: Once | ORAL | Status: DC | PRN

## 2014-04-25 MED ORDER — HYDROMORPHONE HCL PF 1 MG/ML IJ SOLN: 1.0000 mg | INTRAMUSCULAR | Status: DC | PRN

## 2014-04-25 MED ORDER — NEOSTIGMINE METHYLSULFATE 10 MG/10ML IV SOLN
INTRAVENOUS | Status: AC
  Filled 2014-04-25: qty 1

## 2014-04-25 MED ORDER — DEXAMETHASONE SODIUM PHOSPHATE 4 MG/ML IJ SOLN
INTRAMUSCULAR | Status: DC | PRN
  Administered 2014-04-25: 8 mg via INTRAVENOUS

## 2014-04-25 MED ORDER — KETOROLAC TROMETHAMINE 30 MG/ML IJ SOLN
30.0000 mg | Freq: Four times a day (QID) | INTRAMUSCULAR | Status: DC
  Administered 2014-04-25: 30 mg via INTRAVENOUS
  Filled 2014-04-25: qty 1

## 2014-04-25 MED ORDER — GLYCOPYRROLATE 0.2 MG/ML IJ SOLN
INTRAMUSCULAR | Status: AC
  Filled 2014-04-25: qty 3

## 2014-04-25 MED ORDER — SODIUM CHLORIDE 0.9 % IJ SOLN: 3.0000 mL | INTRAMUSCULAR | Status: DC | PRN

## 2014-04-25 MED ORDER — FENTANYL CITRATE 0.05 MG/ML IJ SOLN
INTRAMUSCULAR | Status: DC | PRN
  Administered 2014-04-25: 100 ug via INTRAVENOUS
  Administered 2014-04-25 (×3): 50 ug via INTRAVENOUS
  Administered 2014-04-25: 100 ug via INTRAVENOUS
  Administered 2014-04-25: 50 ug via INTRAVENOUS

## 2014-04-25 MED ORDER — FENTANYL CITRATE 0.05 MG/ML IJ SOLN
INTRAMUSCULAR | Status: AC
  Filled 2014-04-25: qty 5

## 2014-04-25 MED ORDER — ROCURONIUM BROMIDE 100 MG/10ML IV SOLN
INTRAVENOUS | Status: DC | PRN
  Administered 2014-04-25: 40 mg via INTRAVENOUS

## 2014-04-25 MED ORDER — ONDANSETRON HCL 4 MG/2ML IJ SOLN: 4.0000 mg | INTRAMUSCULAR | Status: DC | PRN

## 2014-04-25 MED ORDER — ALBUTEROL SULFATE (2.5 MG/3ML) 0.083% IN NEBU
2.5000 mg | INHALATION_SOLUTION | Freq: Every day | RESPIRATORY_TRACT | Status: DC | PRN
Start: 2014-04-25 — End: 2014-04-25

## 2014-04-25 MED ORDER — ONDANSETRON HCL 4 MG/2ML IJ SOLN
INTRAMUSCULAR | Status: DC | PRN
  Administered 2014-04-25 (×2): 4 mg via INTRAVENOUS

## 2014-04-25 MED ORDER — ROCURONIUM BROMIDE 50 MG/5ML IV SOLN
INTRAVENOUS | Status: AC
  Filled 2014-04-25: qty 1

## 2014-04-25 MED ORDER — SODIUM CHLORIDE 0.9 % IR SOLN
Status: DC | PRN
  Administered 2014-04-25: 08:00:00

## 2014-04-25 MED ORDER — OXYCODONE HCL 5 MG PO TABS: 5.0000 mg | ORAL_TABLET | Freq: Once | ORAL | Status: DC | PRN

## 2014-04-25 MED ORDER — SODIUM CHLORIDE 0.9 % IV SOLN: 250.0000 mL | INTRAVENOUS | Status: DC

## 2014-04-25 MED ORDER — BUPIVACAINE HCL (PF) 0.25 % IJ SOLN
INTRAMUSCULAR | Status: DC | PRN
  Administered 2014-04-25: 10 mL

## 2014-04-25 MED ORDER — LACTATED RINGERS IV SOLN
INTRAVENOUS | Status: DC | PRN
  Administered 2014-04-25 (×2): via INTRAVENOUS

## 2014-04-25 MED ORDER — KCL IN DEXTROSE-NACL 20-5-0.45 MEQ/L-%-% IV SOLN
80.0000 mL/h | INTRAVENOUS | Status: DC
  Filled 2014-04-25 (×2): qty 1000

## 2014-04-25 MED ORDER — OXYCODONE-ACETAMINOPHEN 5-325 MG PO TABS: 1.0000 | ORAL_TABLET | ORAL | Status: DC | PRN

## 2014-04-25 MED ORDER — HYDROMORPHONE HCL PF 1 MG/ML IJ SOLN: 0.2500 mg | INTRAMUSCULAR | Status: DC | PRN

## 2014-04-25 MED ORDER — 0.9 % SODIUM CHLORIDE (POUR BTL) OPTIME
TOPICAL | Status: DC | PRN
  Administered 2014-04-25: 1000 mL

## 2014-04-25 MED ORDER — SODIUM CHLORIDE 0.9 % IJ SOLN
3.0000 mL | Freq: Two times a day (BID) | INTRAMUSCULAR | Status: DC
Start: 2014-04-25 — End: 2014-04-25
  Administered 2014-04-25: 3 mL via INTRAVENOUS

## 2014-04-25 MED ORDER — NEOSTIGMINE METHYLSULFATE 10 MG/10ML IV SOLN
INTRAVENOUS | Status: DC | PRN
  Administered 2014-04-25: 3 mg via INTRAVENOUS

## 2014-04-25 MED ORDER — MENTHOL 3 MG MT LOZG: 1.0000 | LOZENGE | OROMUCOSAL | Status: DC | PRN

## 2014-04-25 MED ORDER — PHENYLEPHRINE HCL 10 MG/ML IJ SOLN
INTRAMUSCULAR | Status: DC | PRN
  Administered 2014-04-25 (×2): 40 ug via INTRAVENOUS
  Administered 2014-04-25 (×3): 80 ug via INTRAVENOUS
  Administered 2014-04-25: 40 ug via INTRAVENOUS
  Administered 2014-04-25 (×3): 80 ug via INTRAVENOUS
  Administered 2014-04-25: 40 ug via INTRAVENOUS
  Administered 2014-04-25 (×2): 80 ug via INTRAVENOUS

## 2014-04-25 MED ORDER — ACETAMINOPHEN 325 MG PO TABS: 650.0000 mg | ORAL_TABLET | ORAL | Status: DC | PRN

## 2014-04-25 MED ORDER — PANTOPRAZOLE SODIUM 40 MG IV SOLR
40.0000 mg | Freq: Every day | INTRAVENOUS | Status: DC
  Filled 2014-04-25: qty 40

## 2014-04-25 MED ORDER — EPHEDRINE SULFATE 50 MG/ML IJ SOLN
INTRAMUSCULAR | Status: DC | PRN
  Administered 2014-04-25 (×2): 2.5 mg via INTRAVENOUS
  Administered 2014-04-25: 5 mg via INTRAVENOUS

## 2014-04-25 MED ORDER — THROMBIN 5000 UNITS EX SOLR
CUTANEOUS | Status: DC | PRN
  Administered 2014-04-25 (×2): 5000 [IU] via TOPICAL

## 2014-04-25 MED ORDER — LIDOCAINE HCL (CARDIAC) 20 MG/ML IV SOLN
INTRAVENOUS | Status: AC
  Filled 2014-04-25: qty 5

## 2014-04-25 MED ORDER — CYCLOBENZAPRINE HCL 10 MG PO TABS
10.0000 mg | ORAL_TABLET | Freq: Three times a day (TID) | ORAL | Status: DC | PRN
Start: 2014-04-25 — End: 2014-04-25

## 2014-04-25 MED ORDER — GLYCOPYRROLATE 0.2 MG/ML IJ SOLN
INTRAMUSCULAR | Status: DC | PRN
  Administered 2014-04-25: .5 mg via INTRAVENOUS

## 2014-04-25 MED ORDER — PHENOL 1.4 % MT LIQD: 1.0000 | OROMUCOSAL | Status: DC | PRN

## 2014-04-25 MED ORDER — PROPOFOL 10 MG/ML IV BOLUS
INTRAVENOUS | Status: DC | PRN
  Administered 2014-04-25: 150 mg via INTRAVENOUS

## 2014-04-25 MED ORDER — METOCLOPRAMIDE HCL 5 MG/ML IJ SOLN: 10.0000 mg | Freq: Once | INTRAMUSCULAR | Status: DC | PRN

## 2014-04-25 MED ORDER — PROPOFOL 10 MG/ML IV BOLUS
INTRAVENOUS | Status: AC
  Filled 2014-04-25: qty 20

## 2014-04-25 MED ORDER — CEFAZOLIN SODIUM 1-5 GM-% IV SOLN
1.0000 g | Freq: Three times a day (TID) | INTRAVENOUS | Status: DC
  Filled 2014-04-25: qty 50

## 2014-04-25 MED ORDER — MIDAZOLAM HCL 2 MG/2ML IJ SOLN
INTRAMUSCULAR | Status: AC
  Filled 2014-04-25: qty 2

## 2014-04-25 MED ORDER — ACETAMINOPHEN 650 MG RE SUPP: 650.0000 mg | RECTAL | Status: DC | PRN

## 2014-04-25 MED ORDER — HEMOSTATIC AGENTS (NO CHARGE) OPTIME
TOPICAL | Status: DC | PRN
  Administered 2014-04-25: 1 via TOPICAL

## 2014-04-25 MED ORDER — LIDOCAINE HCL (CARDIAC) 20 MG/ML IV SOLN
INTRAVENOUS | Status: DC | PRN
  Administered 2014-04-25: 160 mg via INTRAVENOUS

## 2014-04-25 MED ORDER — ALBUTEROL SULFATE HFA 108 (90 BASE) MCG/ACT IN AERS: 2.0000 | INHALATION_SPRAY | Freq: Every day | RESPIRATORY_TRACT | Status: DC | PRN

## 2014-04-25 MED ORDER — MIDAZOLAM HCL 5 MG/5ML IJ SOLN
INTRAMUSCULAR | Status: DC | PRN
  Administered 2014-04-25: 2 mg via INTRAVENOUS

## 2014-04-25 SURGICAL SUPPLY — 65 items
ADH SKN CLS APL DERMABOND .7 (GAUZE/BANDAGES/DRESSINGS) ×1
APL SKNCLS STERI-STRIP NONHPOA (GAUZE/BANDAGES/DRESSINGS) ×1
BAG DECANTER FOR FLEXI CONT (MISCELLANEOUS) ×3 IMPLANT
BENZOIN TINCTURE PRP APPL 2/3 (GAUZE/BANDAGES/DRESSINGS) ×4 IMPLANT
BLADE 10 SAFETY STRL DISP (BLADE) ×3 IMPLANT
BLADE SURG ROTATE 9660 (MISCELLANEOUS) IMPLANT
BRUSH SCRUB EZ PLAIN DRY (MISCELLANEOUS) ×3 IMPLANT
CLOSURE WOUND 1/2 X4 (GAUZE/BANDAGES/DRESSINGS) ×1
CONT SPEC 4OZ CLIKSEAL STRL BL (MISCELLANEOUS) IMPLANT
COVER BACK TABLE 24X17X13 BIG (DRAPES) IMPLANT
COVER TABLE BACK 60X90 (DRAPES) ×3 IMPLANT
DERMABOND ADVANCED (GAUZE/BANDAGES/DRESSINGS) ×2
DERMABOND ADVANCED .7 DNX12 (GAUZE/BANDAGES/DRESSINGS) ×1 IMPLANT
DRAPE C-ARM 42X72 X-RAY (DRAPES) ×3 IMPLANT
DRAPE C-ARMOR (DRAPES) ×3 IMPLANT
DRAPE LAPAROTOMY 100X72X124 (DRAPES) ×3 IMPLANT
DRAPE SURG 17X23 STRL (DRAPES) ×6 IMPLANT
DRESSING TELFA 8X3 (GAUZE/BANDAGES/DRESSINGS) ×3 IMPLANT
DRSG OPSITE POSTOP 4X8 (GAUZE/BANDAGES/DRESSINGS) ×2 IMPLANT
ELECT BLADE 4.0 EZ CLEAN MEGAD (MISCELLANEOUS) ×3
ELECT REM PT RETURN 9FT ADLT (ELECTROSURGICAL) ×3
ELECTRODE BLDE 4.0 EZ CLN MEGD (MISCELLANEOUS) ×1 IMPLANT
ELECTRODE REM PT RTRN 9FT ADLT (ELECTROSURGICAL) ×1 IMPLANT
EVACUATOR 1/8 PVC DRAIN (DRAIN) IMPLANT
GAUZE SPONGE 4X4 16PLY XRAY LF (GAUZE/BANDAGES/DRESSINGS) IMPLANT
GLOVE BIO SURGEON STRL SZ8 (GLOVE) IMPLANT
GLOVE BIOGEL M 8.0 STRL (GLOVE) ×2 IMPLANT
GLOVE BIOGEL PI IND STRL 7.0 (GLOVE) IMPLANT
GLOVE BIOGEL PI INDICATOR 7.0 (GLOVE) ×6
GLOVE ECLIPSE 7.5 STRL STRAW (GLOVE) ×2 IMPLANT
GLOVE ECLIPSE 8.0 STRL XLNG CF (GLOVE) ×3 IMPLANT
GLOVE EXAM NITRILE LRG STRL (GLOVE) IMPLANT
GLOVE EXAM NITRILE XL STR (GLOVE) IMPLANT
GLOVE EXAM NITRILE XS STR PU (GLOVE) IMPLANT
GLOVE SS BIOGEL STRL SZ 6.5 (GLOVE) IMPLANT
GLOVE SUPERSENSE BIOGEL SZ 6.5 (GLOVE) ×6
GOWN BRE IMP SLV AUR LG STRL (GOWN DISPOSABLE) ×3 IMPLANT
GOWN BRE IMP SLV AUR XL STRL (GOWN DISPOSABLE) ×2 IMPLANT
GOWN STRL REIN 2XL LVL4 (GOWN DISPOSABLE) ×3 IMPLANT
GOWN STRL REUS W/ TWL LRG LVL3 (GOWN DISPOSABLE) IMPLANT
GOWN STRL REUS W/ TWL XL LVL3 (GOWN DISPOSABLE) IMPLANT
GOWN STRL REUS W/TWL LRG LVL3 (GOWN DISPOSABLE) ×3
GOWN STRL REUS W/TWL XL LVL3 (GOWN DISPOSABLE) ×6
KIT BASIN OR (CUSTOM PROCEDURE TRAY) ×3 IMPLANT
KIT ROOM TURNOVER OR (KITS) ×3 IMPLANT
NEEDLE HYPO 22GX1.5 SAFETY (NEEDLE) ×3 IMPLANT
NS IRRIG 1000ML POUR BTL (IV SOLUTION) ×3 IMPLANT
PACK LAMINECTOMY NEURO (CUSTOM PROCEDURE TRAY) ×3 IMPLANT
PIN EXCHANGE 500MM (PIN) ×2 IMPLANT
PIN STEINMAN BLUNT 2.4X300MM (PIN) ×2 IMPLANT
PIN STEINMAN TROCAR 300MM (PIN) ×6 IMPLANT
PUTTY BONE GRAFT KIT 2.5ML (Bone Implant) ×2 IMPLANT
SCREW CANN SI JT FUS 12.5X45 (Screw) ×4 IMPLANT
SCREW CANN SI JT FUS 7X45 (Screw) ×2 IMPLANT
SPONGE GAUZE 4X4 12PLY (GAUZE/BANDAGES/DRESSINGS) ×3 IMPLANT
SPONGE LAP 4X18 X RAY DECT (DISPOSABLE) IMPLANT
SPONGE SURGIFOAM ABS GEL SZ50 (HEMOSTASIS) IMPLANT
STRIP CLOSURE SKIN 1/2X4 (GAUZE/BANDAGES/DRESSINGS) ×2 IMPLANT
SUT VIC AB 2-0 OS6 18 (SUTURE) ×17 IMPLANT
SUT VIC AB 3-0 CP2 18 (SUTURE) ×7 IMPLANT
SYR 20ML ECCENTRIC (SYRINGE) ×3 IMPLANT
TOWEL OR 17X24 6PK STRL BLUE (TOWEL DISPOSABLE) ×3 IMPLANT
TOWEL OR 17X26 10 PK STRL BLUE (TOWEL DISPOSABLE) ×3 IMPLANT
TRAY FOLEY CATH 14FRSI W/METER (CATHETERS) IMPLANT
WATER STERILE IRR 1000ML POUR (IV SOLUTION) ×3 IMPLANT

## 2014-04-25 NOTE — Discharge Summary (Signed)
  Physician Discharge Summary  Patient ID: Tiffany Barry MRN: [id number] DOB/AGE: [birth year] 43 y.o.  Admit date: 04/25/2014 Discharge date: 04/25/2014  Admission Diagnoses:  Discharge Diagnoses:  Active Problems:   Sacroiliac joint disease   Discharged Condition: good  Hospital Course: Surgery in am for SI joint issues. Did well. Ambultaed wothout difficulty on crutches. Wound clean and dry. Home same day, specific instructions given.  Consults: None  Significant Diagnostic Studies: none  Treatments: surgery: Right SI joint fusion  Discharge Exam: Blood pressure 102/66, pulse 83, temperature 97.8 F (36.6 C), temperature source Oral, resp. rate 18, weight 50.803 kg (112 lb), last menstrual period 06/19/2011, SpO2 98.00%. Incision/Wound:clean and dry; no new neuro issues  Disposition: 01-Home or Self Care     Medication List    ASK your doctor about these medications       albuterol 108 (90 BASE) MCG/ACT inhaler  Commonly known as:  PROVENTIL HFA;VENTOLIN HFA  Inhale 2 puffs into the lungs daily as needed for wheezing or shortness of breath.     metaxalone 800 MG tablet  Commonly known as:  SKELAXIN  Take 800 mg by mouth daily as needed for muscle spasms.     OVER THE COUNTER MEDICATION  Take 1 tablet by mouth daily. OTC ACID REDUCER.           Follow-up Information   Follow up with Reinaldo MeekerKRITZER,Gianlucca Szymborski O, MD.   Specialty:  Neurosurgery   Contact information:   [address] [place] [address] [phone]       At home rest most of the time. Get up 9 or 10 times each day and take a 15 or 20 minute walk. No riding in the car and to your first postoperative appointment. If you have neck surgery you may shower from the chest down starting on the third postoperative day. If you had back surgery he may start showering on the third postoperative day with saran wrap wrapped around your incisional area 3 times. After the shower remove the saran wrap.  Take pain medicine as needed and other medications as instructed. Call my office for an appointment.  Signed: Reinaldo Meekerandy O Alexxus Sobh, MD 04/25/2014, 2:03 PM

## 2014-04-25 NOTE — Plan of Care (Signed)
Problem: Consults Goal: Diagnosis - Spinal Surgery Outcome: Completed/Met Date Met:  04/25/14 Right sacroiliac joint fusion

## 2014-04-25 NOTE — Anesthesia Procedure Notes (Signed)
Procedure Name: Intubation Date/Time: 04/25/2014 7:44 AM Performed by: Armandina GemmaMIRARCHI, Donal Lynam Pre-anesthesia Checklist: Patient identified, Timeout performed, Emergency Drugs available, Suction available and Patient being monitored Patient Re-evaluated:Patient Re-evaluated prior to inductionOxygen Delivery Method: Circle system utilized Preoxygenation: Pre-oxygenation with 100% oxygen Intubation Type: IV induction Ventilation: Mask ventilation without difficulty Laryngoscope Size: Miller and 2 Grade View: Grade I Tube type: Oral Tube size: 7.0 mm Number of attempts: 1 Airway Equipment and Method: Stylet Placement Confirmation: ETT inserted through vocal cords under direct vision,  breath sounds checked- equal and bilateral and positive ETCO2 Secured at: 22 cm Tube secured with: Tape Dental Injury: Teeth and Oropharynx as per pre-operative assessment  Comments: IV induction fredrick- chipped teeth in front esp- r upper prior to laryngoscopy- intubation atraumatic teeth as preop - + bilat BS per fredrick

## 2014-04-25 NOTE — Anesthesia Postprocedure Evaluation (Signed)
Anesthesia Post Note  Patient: Tiffany Barry  Procedure(s) Performed: Procedure(s) (LRB): SI Joint Fusion (N/A)  Anesthesia type: General  Patient location: PACU  Post pain: Pain level controlled  Post assessment: Patient's Cardiovascular Status Stable  Last Vitals:  Filed Vitals:   04/25/14 1057  BP: 118/59  Pulse: 87  Temp: 36.4 C  Resp: 16    Post vital signs: Reviewed and stable  Level of consciousness: alert  Complications: No apparent anesthesia complications

## 2014-04-25 NOTE — Transfer of Care (Signed)
Immediate Anesthesia Transfer of Care Note  Patient: Tiffany Barry  Procedure(s) Performed: Procedure(s): SI Joint Fusion (N/A)  Patient Location: PACU  Anesthesia Type:General  Level of Consciousness: awake, alert  and oriented  Airway & Oxygen Therapy: Patient Spontanous Breathing and Patient connected to nasal cannula oxygen  Post-op Assessment: Report given to PACU RN and Post -op Vital signs reviewed and stable  Post vital signs: Reviewed and stable  Complications: No apparent anesthesia complications

## 2014-04-25 NOTE — Op Note (Signed)
Preop diagnosis: Right sacroiliac joint dysfunction Postop diagnosis: Same Procedure: Right sacroiliac joint fusion with Zimmer TriCor SI joint system Surgeon: Insurance risk surveyorKritzer Assistant: Bot[place]  After being placed the prone position x-ray alignment was carried out to identify our entry point. The area was then prepped and draped in the usual sterile fashion. Linear incision was made inferior to the line demarcated the back of the sacrum and posterior to the line marked in the sacral ala. Incision was carried down to subcutaneous tissue subcutaneous tract was placed for exposure. We passed our guidewire through the sacral ala into the sacrum. The ala was quite well and it took a few probes to find a good entry point. We then advanced the guidewire to the appropriate depth following under lateral outlet and inlet views to make sure had a good trajectory. We then did dilation through the soft tissue and drilled up to an appropriate depth. We then tapped and placed a 45 mm bolt filled with a mixture of autologous bone and morselized allograft. This was followed in good position. We then used our guide over the guidewire to do another entry 0.20 mm away from the initial wire. We did the same process once again using a dilation through the soft tissue a drilling and then tapping in place another 45 mm bolt. We finally placed a third smaller bolt after drilling and tapping with a smaller drill and the smaller tap. This was a 40 mm bolt and also followed in excellent position. Final fluoroscopy in inlet outlet and lateral views all showed good positioning of the instrumentation. The was then irrigated copiously and closed in multiple layers of Vicryl on the subcutaneous and subcuticular tissues. Dermabond Steri-Strips were placed on the skin. Shortness was then applied the patient was extubated and taken to recovery in stable condition.

## 2014-04-25 NOTE — Anesthesia Preprocedure Evaluation (Addendum)
Anesthesia Evaluation  Patient identified by MRN, date of birth, ID band Patient awake    Reviewed: Allergy & Precautions, H&P , NPO status , Patient's Chart, lab work & pertinent test results, reviewed documented beta blocker date and time   Airway Mallampati: II TM Distance: >3 FB Neck ROM: full    Dental  (+) Dental Advisory Given, Caps, Chipped   Pulmonary asthma , Current Smoker,  breath sounds clear to auscultation        Cardiovascular + Valvular Problems/Murmurs Rhythm:regular     Neuro/Psych  Headaches, negative psych ROS   GI/Hepatic Neg liver ROS, GERD-  Medicated and Controlled,  Endo/Other  negative endocrine ROS  Renal/GU negative Renal ROS  negative genitourinary   Musculoskeletal   Abdominal   Peds  Hematology negative hematology ROS (+)   Anesthesia Other Findings See surgeon's H&P   Reproductive/Obstetrics negative OB ROS                         Anesthesia Physical Anesthesia Plan  ASA: II  Anesthesia Plan: General   Post-op Pain Management:    Induction: Intravenous  Airway Management Planned: Oral ETT  Additional Equipment:   Intra-op Plan:   Post-operative Plan: Extubation in OR  Informed Consent: I have reviewed the patients History and Physical, chart, labs and discussed the procedure including the risks, benefits and alternatives for the proposed anesthesia with the patient or authorized representative who has indicated his/her understanding and acceptance.   Dental Advisory Given  Plan Discussed with: CRNA and Surgeon  Anesthesia Plan Comments:         Anesthesia Quick Evaluation

## 2014-04-25 NOTE — Progress Notes (Signed)
Orthopedic Tech Progress Note Patient Details:  Ignacia MarvelDawn M Nevils 11/23/1971 [id number]  Ortho Devices Type of Ortho Device: Crutches Ortho Device/Splint Location: patient is fitted with crutches weat and care are gone over verbally. patient states she is familiar with crutches and needed minimal instruction.   Early CharsWilliam Anthony Cruzito Standre 04/25/2014, 1:55 PM
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
## 2014-05-05 ENCOUNTER — Encounter (HOSPITAL_COMMUNITY): Payer: Self-pay | Admitting: Neurosurgery

## 2014-05-19 ENCOUNTER — Encounter (HOSPITAL_COMMUNITY): Payer: Self-pay | Admitting: Neurosurgery

## 2014-06-09 NOTE — OR Nursing (Signed)
Addendum to scope page 

## 2014-07-11 ENCOUNTER — Other Ambulatory Visit: Payer: Self-pay | Admitting: Physical Medicine and Rehabilitation

## 2014-07-11 DIAGNOSIS — M461 Sacroiliitis, not elsewhere classified: Secondary | ICD-10-CM

## 2014-07-18 ENCOUNTER — Ambulatory Visit
Admission: RE | Admit: 2014-07-18 | Discharge: 2014-07-18 | Disposition: A | Discharge status: Home / Self Care | Payer: Medicaid Other | Source: Ambulatory Visit | Attending: Physical Medicine and Rehabilitation | Admitting: Physical Medicine and Rehabilitation

## 2014-07-18 ENCOUNTER — Other Ambulatory Visit: Payer: Self-pay | Admitting: Physical Medicine and Rehabilitation

## 2014-07-18 VITALS — BP 119/54 | HR 93 | Temp 98.6°F | Resp 20 | Ht 62.0 in

## 2014-07-18 DIAGNOSIS — M461 Sacroiliitis, not elsewhere classified: Secondary | ICD-10-CM

## 2014-07-18 DIAGNOSIS — M533 Sacrococcygeal disorders, not elsewhere classified: Secondary | ICD-10-CM

## 2014-07-18 DIAGNOSIS — M5136 Other intervertebral disc degeneration, lumbar region: Secondary | ICD-10-CM

## 2014-07-18 MED ORDER — MIDAZOLAM HCL 2 MG/2ML IJ SOLN
1.0000 mg | INTRAMUSCULAR | Status: DC | PRN
  Administered 2014-07-18 (×2): 1 mg via INTRAVENOUS

## 2014-07-18 MED ORDER — FENTANYL CITRATE 0.05 MG/ML IJ SOLN
25.0000 ug | INTRAMUSCULAR | Status: DC | PRN
  Administered 2014-07-18: 100 ug via INTRAVENOUS

## 2014-07-18 MED ORDER — ONDANSETRON HCL 4 MG/2ML IJ SOLN: 4.0000 mg | Freq: Four times a day (QID) | INTRAMUSCULAR | Status: DC | PRN

## 2014-07-18 MED ORDER — CEFAZOLIN SODIUM-DEXTROSE 2-3 GM-% IV SOLR
2.0000 g | Freq: Once | INTRAVENOUS | Status: AC
  Administered 2014-07-18: 2 g via INTRAVENOUS

## 2014-07-18 MED ORDER — IOHEXOL 180 MG/ML  SOLN
2.5000 mL | Freq: Once | INTRAMUSCULAR | Status: AC | PRN
  Administered 2014-07-18: 2.5 mL

## 2014-07-18 MED ORDER — KETOROLAC TROMETHAMINE 30 MG/ML IJ SOLN
30.0000 mg | Freq: Once | INTRAMUSCULAR | Status: AC
  Administered 2014-07-18: 30 mg via INTRAVENOUS

## 2014-07-18 MED ORDER — SODIUM CHLORIDE 0.9 % IV SOLN
Freq: Once | INTRAVENOUS | Status: AC
  Administered 2014-07-18: 08:00:00 via INTRAVENOUS

## 2014-07-18 NOTE — Progress Notes (Signed)
35 minutes sedation time for discogram.  jkl

## 2014-07-18 NOTE — Discharge Instructions (Signed)
Discogram Post Procedure Discharge Instructions ° °1. May resume a regular diet and any medications that you routinely take (including pain medications). °2. No driving day of procedure. °3. Upon discharge go home and rest for at least 4 hours.  May use an ice pack as needed to injection sites on back.  Ice to back 30 minutes on and 30 minutes off, all day. °4. May remove bandades later, today. °5. It is not unusual to be sore for several days after this procedure. ° ° ° °Please contact our office at [phone] for the following symptoms: ° °· Fever greater than 100 degrees °· Increased swelling, pain, or redness at injection site. ° ° °Thank you for visiting [facility]. ° ° °

## 2014-07-30 ENCOUNTER — Other Ambulatory Visit: Payer: Self-pay | Admitting: Obstetrics and Gynecology

## 2014-08-07 ENCOUNTER — Other Ambulatory Visit: Payer: Self-pay | Admitting: *Deleted

## 2014-08-07 MED ORDER — FLUCONAZOLE 150 MG PO TABS: 150.0000 mg | ORAL_TABLET | ORAL | Status: DC

## 2014-08-29 DIAGNOSIS — G8929 Other chronic pain: Secondary | ICD-10-CM | POA: Insufficient documentation

## 2015-02-06 ENCOUNTER — Other Ambulatory Visit: Payer: Self-pay | Admitting: Obstetrics and Gynecology

## 2015-02-27 ENCOUNTER — Emergency Department (HOSPITAL_COMMUNITY): Payer: Medicaid Other

## 2015-02-27 ENCOUNTER — Encounter (HOSPITAL_COMMUNITY): Payer: Self-pay | Admitting: Emergency Medicine

## 2015-02-27 ENCOUNTER — Emergency Department (HOSPITAL_COMMUNITY)
Admission: EM | Admit: 2015-02-27 | Discharge: 2015-02-27 | Disposition: A | Discharge status: Home / Self Care | Payer: Medicaid Other | Attending: Emergency Medicine | Admitting: Emergency Medicine

## 2015-02-27 DIAGNOSIS — R011 Cardiac murmur, unspecified: Secondary | ICD-10-CM | POA: Diagnosis not present

## 2015-02-27 DIAGNOSIS — Z981 Arthrodesis status: Secondary | ICD-10-CM | POA: Insufficient documentation

## 2015-02-27 DIAGNOSIS — K219 Gastro-esophageal reflux disease without esophagitis: Secondary | ICD-10-CM | POA: Diagnosis not present

## 2015-02-27 DIAGNOSIS — J45909 Unspecified asthma, uncomplicated: Secondary | ICD-10-CM | POA: Diagnosis not present

## 2015-02-27 DIAGNOSIS — Y9289 Other specified places as the place of occurrence of the external cause: Secondary | ICD-10-CM | POA: Diagnosis not present

## 2015-02-27 DIAGNOSIS — W228XXA Striking against or struck by other objects, initial encounter: Secondary | ICD-10-CM | POA: Diagnosis not present

## 2015-02-27 DIAGNOSIS — S5001XA Contusion of right elbow, initial encounter: Secondary | ICD-10-CM | POA: Insufficient documentation

## 2015-02-27 DIAGNOSIS — Y9389 Activity, other specified: Secondary | ICD-10-CM | POA: Diagnosis not present

## 2015-02-27 DIAGNOSIS — G8929 Other chronic pain: Secondary | ICD-10-CM | POA: Diagnosis not present

## 2015-02-27 DIAGNOSIS — Y998 Other external cause status: Secondary | ICD-10-CM | POA: Diagnosis not present

## 2015-02-27 DIAGNOSIS — M1990 Unspecified osteoarthritis, unspecified site: Secondary | ICD-10-CM | POA: Diagnosis not present

## 2015-02-27 DIAGNOSIS — Z79899 Other long term (current) drug therapy: Secondary | ICD-10-CM | POA: Diagnosis not present

## 2015-02-27 DIAGNOSIS — S59901A Unspecified injury of right elbow, initial encounter: Secondary | ICD-10-CM | POA: Diagnosis present

## 2015-02-27 DIAGNOSIS — Z720 Tobacco use: Secondary | ICD-10-CM | POA: Diagnosis not present

## 2015-02-27 MED ORDER — HYDROCODONE-ACETAMINOPHEN 5-325 MG PO TABS: ORAL_TABLET | ORAL | Status: DC

## 2015-02-27 MED ORDER — NAPROXEN 500 MG PO TABS: 500.0000 mg | ORAL_TABLET | Freq: Two times a day (BID) | ORAL | Status: DC

## 2015-02-27 NOTE — ED Notes (Signed)
Having pain to right elbow for 10 days, rates 8.  Have not taken any medication.

## 2015-02-27 NOTE — Discharge Instructions (Signed)
Contusion °A contusion is a deep bruise. Contusions happen when an injury causes bleeding under the skin. Signs of bruising include pain, puffiness (swelling), and discolored skin. The contusion may turn blue, purple, or yellow. °HOME CARE  °· Put ice on the injured area. °¨ Put ice in a plastic bag. °¨ Place a towel between your skin and the bag. °¨ Leave the ice on for 15-20 minutes, 03-04 times a day. °· Only take medicine as told by your doctor. °· Rest the injured area. °· If possible, raise (elevate) the injured area to lessen puffiness. °GET HELP RIGHT AWAY IF:  °· You have more bruising or puffiness. °· You have pain that is getting worse. °· Your puffiness or pain is not helped by medicine. °MAKE SURE YOU:  °· Understand these instructions. °· Will watch your condition. °· Will get help right away if you are not doing well or get worse. °Document Released: 05/19/2008 Document Revised: 02/23/2012 Document Reviewed: 10/06/2011 °[employer]® Patient Information ©2015 [employer]. This information is not intended to replace advice given to you by your health care provider. Make sure you discuss any questions you have with your health care provider. ° °

## 2015-02-27 NOTE — ED Notes (Signed)
nad noted prior to dc. Dc instructions reviewed and explained. Voiced understanding. 2 Rx given prior to dc.
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
## 2015-03-16 ENCOUNTER — Other Ambulatory Visit: Payer: Self-pay | Admitting: Obstetrics and Gynecology

## 2015-04-26 ENCOUNTER — Other Ambulatory Visit: Payer: Self-pay | Admitting: Obstetrics and Gynecology

## 2015-05-16 ENCOUNTER — Other Ambulatory Visit: Payer: Self-pay | Admitting: Sports Medicine

## 2015-05-16 DIAGNOSIS — M7711 Lateral epicondylitis, right elbow: Secondary | ICD-10-CM

## 2015-05-19 ENCOUNTER — Other Ambulatory Visit: Payer: Self-pay | Admitting: Obstetrics and Gynecology

## 2015-05-21 ENCOUNTER — Ambulatory Visit
Admission: RE | Admit: 2015-05-21 | Discharge: 2015-05-21 | Disposition: A | Discharge status: Home / Self Care | Payer: Medicaid Other | Source: Ambulatory Visit | Attending: Sports Medicine | Admitting: Sports Medicine

## 2015-05-21 DIAGNOSIS — M7711 Lateral epicondylitis, right elbow: Secondary | ICD-10-CM

## 2015-06-27 ENCOUNTER — Other Ambulatory Visit: Payer: Self-pay | Admitting: Obstetrics and Gynecology

## 2015-10-01 ENCOUNTER — Other Ambulatory Visit: Payer: Self-pay | Admitting: Obstetrics and Gynecology

## 2015-11-23 ENCOUNTER — Other Ambulatory Visit (HOSPITAL_COMMUNITY): Payer: Self-pay | Admitting: Orthopedic Surgery

## 2015-11-23 DIAGNOSIS — M25521 Pain in right elbow: Secondary | ICD-10-CM

## 2015-12-11 ENCOUNTER — Other Ambulatory Visit (HOSPITAL_COMMUNITY): Payer: Medicaid Other

## 2015-12-19 ENCOUNTER — Ambulatory Visit (HOSPITAL_COMMUNITY)
Admission: RE | Admit: 2015-12-19 | Discharge: 2015-12-19 | Disposition: A | Discharge status: Home / Self Care | Payer: Medicaid Other | Source: Ambulatory Visit | Attending: Orthopedic Surgery | Admitting: Orthopedic Surgery

## 2015-12-19 DIAGNOSIS — M25521 Pain in right elbow: Secondary | ICD-10-CM | POA: Insufficient documentation

## 2016-01-15 ENCOUNTER — Encounter: Payer: Self-pay | Admitting: Adult Health

## 2016-01-15 ENCOUNTER — Ambulatory Visit (INDEPENDENT_AMBULATORY_CARE_PROVIDER_SITE_OTHER): Payer: Medicaid Other | Admitting: Adult Health

## 2016-01-15 VITALS — BP 120/70 | HR 74 | Ht 60.5 in | Wt 108.0 lb

## 2016-01-15 DIAGNOSIS — Z01419 Encounter for gynecological examination (general) (routine) without abnormal findings: Secondary | ICD-10-CM | POA: Diagnosis not present

## 2016-01-15 DIAGNOSIS — Z1212 Encounter for screening for malignant neoplasm of rectum: Secondary | ICD-10-CM

## 2016-01-15 DIAGNOSIS — B9689 Other specified bacterial agents as the cause of diseases classified elsewhere: Secondary | ICD-10-CM

## 2016-01-15 DIAGNOSIS — N760 Acute vaginitis: Secondary | ICD-10-CM | POA: Diagnosis not present

## 2016-01-15 DIAGNOSIS — A499 Bacterial infection, unspecified: Secondary | ICD-10-CM | POA: Diagnosis not present

## 2016-01-15 DIAGNOSIS — N898 Other specified noninflammatory disorders of vagina: Secondary | ICD-10-CM | POA: Insufficient documentation

## 2016-01-15 DIAGNOSIS — Z0000 Encounter for general adult medical examination without abnormal findings: Secondary | ICD-10-CM

## 2016-01-15 HISTORY — DX: Other specified bacterial agents as the cause of diseases classified elsewhere: B96.89

## 2016-01-15 HISTORY — DX: Other specified noninflammatory disorders of vagina: N89.8

## 2016-01-15 LAB — HEMOCCULT GUIAC POC 1CARD (OFFICE): Fecal Occult Blood, POC: NEGATIVE

## 2016-01-15 LAB — POCT WET PREP (WET MOUNT)
Clue Cells Wet Prep Whiff POC: POSITIVE
WBC, Wet Prep HPF POC: POSITIVE

## 2016-01-15 MED ORDER — ESTRADIOL 0.1 MG/GM VA CREA: TOPICAL_CREAM | VAGINAL | Status: DC

## 2016-01-15 MED ORDER — METRONIDAZOLE 500 MG PO TABS: 500.0000 mg | ORAL_TABLET | Freq: Two times a day (BID) | ORAL | Status: DC

## 2016-01-15 NOTE — Patient Instructions (Signed)
Get mammogram  Physical in 1 year Bacterial Vaginosis Bacterial vaginosis is a vaginal infection that occurs when the normal balance of bacteria in the vagina is disrupted. It results from an overgrowth of certain bacteria. This is the most common vaginal infection in women of childbearing age. Treatment is important to prevent complications, especially in pregnant women, as it can cause a premature delivery. CAUSES  Bacterial vaginosis is caused by an increase in harmful bacteria that are normally present in smaller amounts in the vagina. Several different kinds of bacteria can cause bacterial vaginosis. However, the reason that the condition develops is not fully understood. RISK FACTORS Certain activities or behaviors can put you at an increased risk of developing bacterial vaginosis, including:  Having a new sex partner or multiple sex partners.  Douching.  Using an intrauterine device (IUD) for contraception. Women do not get bacterial vaginosis from toilet seats, bedding, swimming pools, or contact with objects around them. SIGNS AND SYMPTOMS  Some women with bacterial vaginosis have no signs or symptoms. Common symptoms include:  Grey vaginal discharge.  A fishlike odor with discharge, especially after sexual intercourse.  Itching or burning of the vagina and vulva.  Burning or pain with urination. DIAGNOSIS  Your health care provider will take a medical history and examine the vagina for signs of bacterial vaginosis. A sample of vaginal fluid may be taken. Your health care provider will look at this sample under a microscope to check for bacteria and abnormal cells. A vaginal pH test may also be done.  TREATMENT  Bacterial vaginosis may be treated with antibiotic medicines. These may be given in the form of a pill or a vaginal cream. A second round of antibiotics may be prescribed if the condition comes back after treatment. Because bacterial vaginosis increases your risk for  sexually transmitted diseases, getting treated can help reduce your risk for chlamydia, gonorrhea, HIV, and herpes. HOME CARE INSTRUCTIONS   Only take over-the-counter or prescription medicines as directed by your health care provider.  If antibiotic medicine was prescribed, take it as directed. Make sure you finish it even if you start to feel better.  Tell all sexual partners that you have a vaginal infection. They should see their health care provider and be treated if they have problems, such as a mild rash or itching.  During treatment, it is important that you follow these instructions:  Avoid sexual activity or use condoms correctly.  Do not douche.  Avoid alcohol as directed by your health care provider.  Avoid breastfeeding as directed by your health care provider. SEEK MEDICAL CARE IF:   Your symptoms are not improving after 3 days of treatment.  You have increased discharge or pain.  You have a fever. MAKE SURE YOU:   Understand these instructions.  Will watch your condition.  Will get help right away if you are not doing well or get worse. FOR MORE INFORMATION  Centers for Disease Control and Prevention, Division of STD Prevention: SolutionApps.co.za American Sexual Health Association (ASHA): www.ashastd.org    This information is not intended to replace advice given to you by your health care provider. Make sure you discuss any questions you have with your health care provider.   Document Released: 12/01/2005 Document Revised: 12/22/2014 Document Reviewed: 07/13/2013 Elsevier Interactive Patient Education 2016 Elsevier Inc. No alcohol with flagyl

## 2016-01-15 NOTE — Progress Notes (Signed)
Patient ID: Tiffany Barry, female   DOB: 1971-06-26, 45 y.o.   MRN: [id number] History of Present Illness: Tiffany Barry is a 45 year old white female, married, in for well woman gyn exam, she is sp hysterectomy.She is complaining of vaginal discharge and itch and vaginal dryness, and sex hurts at times.Denies any hot flashes. Has to have right elbow surgery in February and has back issues too. PCP is [facility].  Current Medications, Allergies, Past Medical History, Past Surgical History, Family History and Social History were reviewed in [employer] record.     Review of Systems: Patient denies any headaches, hearing loss, fatigue, blurred vision, shortness of breath, chest pain, abdominal pain, problems with bowel movements, urination. No joint pain or mood swings. See HPI for positives.   Physical Exam:BP 120/70 mmHg  Pulse 74  Ht 5' 0.5" (1.537 m)  Wt 108 lb (48.988 kg)  BMI 20.74 kg/m2  LMP 06/19/2011 General:  Well developed, well nourished, no acute distress Skin:  Warm and dry Neck:  Midline trachea, normal thyroid, good ROM, no lymphadenopathy Lungs; Clear to auscultation bilaterally Breast:  No dominant palpable mass, retraction, or nipple discharge Cardiovascular: Regular rate and rhythm Abdomen:  Soft, non tender, no hepatosplenomegaly Pelvic:  External genitalia is normal in appearance, no lesions.  The vagina is normal in appearance, has creamy white discharge with odor. Urethra has no lesions or masses. The cervix and uterus are absent.Wet prep is +WBCs and clue cells. No adnexal masses or tenderness noted.Bladder is non tender, no masses felt. Rectal: Good sphincter tone, no polyps, or hemorrhoids felt.  Hemoccult negative. Extremities/musculoskeletal:  No swelling or varicosities noted, no clubbing or cyanosis, ha brace on right elbow. Psych:  No mood changes, alert and cooperative,seems happy   Impression: Well woman gyn exam no pap Vaginal discharge   Vaginal dryness BV    Plan: Rx flagyl 500 mg 1 bid x 7 days, no alcohol, review handout on BV   Rx estrace vaginal cream use 1 gm in vagina at hs for 2 weeks then 2-3 x weekly with 3 refills Use luvena if needed Use astor glide or [place] with sex Get mammogram now  Physical in 1 year Labs at [facility] dept.

## 2016-01-16 ENCOUNTER — Other Ambulatory Visit: Payer: Self-pay | Admitting: Adult Health

## 2016-01-16 HISTORY — PX: ELBOW SURGERY: SHX618

## 2016-01-16 MED ORDER — ESTROGENS, CONJUGATED 0.625 MG/GM VA CREA: TOPICAL_CREAM | VAGINAL | Status: DC

## 2016-02-14 ENCOUNTER — Ambulatory Visit (HOSPITAL_COMMUNITY): Payer: Medicaid Other | Attending: Orthopedic Surgery

## 2016-02-14 ENCOUNTER — Encounter (HOSPITAL_COMMUNITY): Payer: Self-pay

## 2016-02-14 DIAGNOSIS — Z9889 Other specified postprocedural states: Secondary | ICD-10-CM | POA: Insufficient documentation

## 2016-02-14 DIAGNOSIS — R531 Weakness: Secondary | ICD-10-CM | POA: Diagnosis not present

## 2016-02-14 DIAGNOSIS — M25521 Pain in right elbow: Secondary | ICD-10-CM | POA: Insufficient documentation

## 2016-02-14 NOTE — Patient Instructions (Signed)
Your Splint This splint should initially be fitted by a healthcare practitioner.  The healthcare practitioner is responsible for providing wearing instructions and precautions to the patient, other healthcare practitioners and care provider involved in the patient's care.  This splint was custom made for you. Please read the following instructions to learn about wearing and caring for your splint.  Precautions Should your splint cause any of the following problems, remove the splint immediately and contact your therapist/physician.  Swelling  Severe Pain  Pressure Areas  Stiffness  Numbness  Do not wear your splint while operating machinery unless it has been fabricated for that purpose.  When To Wear Your Splint Where your splint according to your therapist/physician instructions. All the time unless your MD states otherwise.  Care and Cleaning of Your Splint 1. Keep your splint away from open flames. 2. Your splint will lose its shape in temperatures over 135 degrees Farenheit, ( in car windows, near radiators, ovens or in hot water).  Never make any adjustments to your splint, if the splint needs adjusting remove it and make an appointment to see your therapist. 3. Your splint, including the cushion liner may be cleaned with soap and lukewarm water.  Do not immerse in hot water over 135 degrees Farenheit. 4. Straps may be washed with soap and water, but do not moisten the self-adhesive portion. 5. For ink or hard to remove spots use a scouring cleanser which contains chlorine.  Rinse the splint thoroughly after using chlorine cleanser. 6.
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
## 2016-02-15 ENCOUNTER — Telehealth (HOSPITAL_COMMUNITY): Payer: Self-pay

## 2016-02-15 NOTE — Telephone Encounter (Signed)
DATE: 02/15/16  Called patient to follow up on splint fabrication. Pt reports that she is making it work there is just one spot by her 5th MCP that is causing her trouble. Offered to have her come to have it adjusted. Pt declined and thinks she'll be able to fix it herself. Encouraged patient to call if she is unable to and we can have her come in to fix it. Pt understood.  Limmie PatriciaLaura Lorilynn Lehr, OTR/L,CBIS  [phone]

## 2016-02-22 ENCOUNTER — Encounter (HOSPITAL_COMMUNITY): Payer: Self-pay

## 2016-03-11 ENCOUNTER — Ambulatory Visit: Payer: Medicaid Other | Admitting: Occupational Therapy

## 2016-03-12 ENCOUNTER — Encounter (HOSPITAL_COMMUNITY): Payer: Self-pay | Admitting: Occupational Therapy

## 2016-03-12 ENCOUNTER — Ambulatory Visit (HOSPITAL_COMMUNITY): Payer: Medicaid Other | Admitting: Occupational Therapy

## 2016-03-12 DIAGNOSIS — M25521 Pain in right elbow: Secondary | ICD-10-CM

## 2016-03-12 DIAGNOSIS — R531 Weakness: Secondary | ICD-10-CM | POA: Diagnosis not present

## 2016-03-12 DIAGNOSIS — Z9889 Other specified postprocedural states: Secondary | ICD-10-CM

## 2016-03-12 NOTE — Patient Instructions (Signed)
  Strengthening Exercises  1) WRIST EXTENSION CURLS - TABLE  Hold a small free weight, rest your forearm on a table and bend your wrist up and down with your palm face down as shown.      2) WRIST FLEXION CURLS - TABLE  Hold a small free weight, rest your forearm on a table and bend your wrist up and down with your palm face up as shown.     3) FREE WEIGHT RADIAL/ULNAR DEVIATION - TABLE  Hold a small free weight, rest your forearm on a table and bend your wrist up and down with your palm facing towards the side as shown.     4) Pronation  Forearm supported on table with wrist in neutral position. Using a weight, roll wrist so that palm faces downward. Hold for 2 seconds and return to starting position.     5) Supination  Forearm supported on table with wrist in neutral position. Using a weight, roll wrist so that palm is now facing upward. Hold for 2 seconds and return to starting position.      6) Elbow flexion & extension with weight With right arm holding weight, complete 10 bicep curls, remaining at a steady pace.    *Complete exercises using __2-3__ pound weight, __10__times each, _1-2___times per day*
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
## 2016-06-03 ENCOUNTER — Other Ambulatory Visit: Payer: Self-pay | Admitting: Adult Health

## 2016-06-30 ENCOUNTER — Encounter (HOSPITAL_BASED_OUTPATIENT_CLINIC_OR_DEPARTMENT_OTHER): Payer: Self-pay | Admitting: *Deleted

## 2016-06-30 ENCOUNTER — Emergency Department (HOSPITAL_BASED_OUTPATIENT_CLINIC_OR_DEPARTMENT_OTHER)
Admission: EM | Admit: 2016-06-30 | Discharge: 2016-06-30 | Disposition: A | Discharge status: Home / Self Care | Payer: Medicaid Other | Attending: Emergency Medicine | Admitting: Emergency Medicine

## 2016-06-30 DIAGNOSIS — F17210 Nicotine dependence, cigarettes, uncomplicated: Secondary | ICD-10-CM | POA: Diagnosis not present

## 2016-06-30 DIAGNOSIS — J9801 Acute bronchospasm: Secondary | ICD-10-CM | POA: Insufficient documentation

## 2016-06-30 DIAGNOSIS — Z720 Tobacco use: Secondary | ICD-10-CM

## 2016-06-30 DIAGNOSIS — J040 Acute laryngitis: Secondary | ICD-10-CM | POA: Diagnosis not present

## 2016-06-30 DIAGNOSIS — R05 Cough: Secondary | ICD-10-CM | POA: Diagnosis present

## 2016-06-30 DIAGNOSIS — J069 Acute upper respiratory infection, unspecified: Secondary | ICD-10-CM | POA: Insufficient documentation

## 2016-06-30 MED ORDER — PREDNISONE 20 MG PO TABS: 40.0000 mg | ORAL_TABLET | Freq: Every day | ORAL | Status: DC

## 2016-06-30 MED ORDER — DEXTROMETHORPHAN-GUAIFENESIN 15-400 MG PO TABS
1.0000 | ORAL_TABLET | ORAL | Status: DC | PRN
Start: 2016-06-30 — End: 2016-11-26

## 2016-06-30 MED FILL — predniSONE 20 MG TABS: 20 | 5 days supply | Qty: 10 | Fill #0

## 2016-06-30 NOTE — Discharge Instructions (Signed)
Bronchospasm, Adult  A bronchospasm is a spasm or tightening of the airways going into the lungs. During a bronchospasm breathing becomes more difficult because the airways get smaller. When this happens there can be coughing, a whistling sound when breathing (wheezing), and difficulty breathing. Bronchospasm is often associated with asthma, but not all patients who experience a bronchospasm have asthma.  CAUSES   A bronchospasm is caused by inflammation or irritation of the airways. The inflammation or irritation may be triggered by:   · Allergies (such as to animals, pollen, food, or mold). Allergens that cause bronchospasm may cause wheezing immediately after exposure or many hours later.    · Infection. Viral infections are believed to be the most common cause of bronchospasm.    · Exercise.    · Irritants (such as pollution, cigarette smoke, strong odors, aerosol sprays, and paint fumes).    · Weather changes. Winds increase molds and pollens in the air. Rain refreshes the air by washing irritants out. Cold air may cause inflammation.    · Stress and emotional upset.    SIGNS AND SYMPTOMS   · Wheezing.    · Excessive nighttime coughing.    · Frequent or severe coughing with a simple cold.    · Chest tightness.    · Shortness of breath.    DIAGNOSIS   Bronchospasm is usually diagnosed through a history and physical exam. Tests, such as chest X-rays, are sometimes done to look for other conditions.  TREATMENT   · Inhaled medicines can be given to open up your airways and help you breathe. The medicines can be given using either an inhaler or a nebulizer machine.  · Corticosteroid medicines may be given for severe bronchospasm, usually when it is associated with asthma.  HOME CARE INSTRUCTIONS   · Always have a plan prepared for seeking medical care. Know when to call your health care provider and local emergency services (911 in the [place] Know where you can access local emergency care.  · Only take medicines as  directed by your health care provider.  · If you were prescribed an inhaler or nebulizer machine, ask your health care provider to explain how to use it correctly. Always use a spacer with your inhaler if you were given one.  · It is necessary to remain calm during an attack. Try to relax and breathe more slowly.   · Control your home environment in the following ways:      Change your heating and air conditioning filter at least once a month.      Limit your use of fireplaces and wood stoves.    Do not smoke and do not allow smoking in your home.      Avoid exposure to perfumes and fragrances.      Get rid of pests (such as roaches and mice) and their droppings.      Throw away plants if you see mold on them.      Keep your house clean and dust free.      Replace carpet with wood, tile, or vinyl flooring. Carpet can trap dander and dust.      Use allergy-proof pillows, mattress covers, and box spring covers.      Wash bed sheets and blankets every week in hot water and dry them in a dryer.      Use blankets that are made of polyester or cotton.      Wash hands frequently.  SEEK MEDICAL CARE IF:   · You have muscle aches.    · You have chest pain.    · The sputum changes from clear or   even after taking your prescribed medicines.   °· You have increased difficulty breathing.   °· You develop severe chest pain. °MAKE SURE YOU:  °· Understand these instructions. °· Will watch your condition. °· Will get help right away if you are not doing well or get worse. °  °This information is not intended to replace advice given to you by your health care provider. Make sure you discuss any questions you have with your health care  provider. °  °Document Released: 12/04/2003 Document Revised: 12/22/2014 Document Reviewed: 05/23/2013 °Elsevier Interactive Patient Education ©2016 [employer] ° °Upper Respiratory Infection, Adult °Most upper respiratory infections (URIs) are a viral infection of the air passages leading to the lungs. A URI affects the nose, throat, and upper air passages. The most common type of URI is nasopharyngitis and is typically referred to as "the common cold." °URIs run their course and usually go away on their own. Most of the time, a URI does not require medical attention, but sometimes a bacterial infection in the upper airways can follow a viral infection. This is called a secondary infection. Sinus and middle ear infections are common types of secondary upper respiratory infections. °Bacterial pneumonia can also complicate a URI. A URI can worsen asthma and chronic obstructive pulmonary disease (COPD). Sometimes, these complications can require emergency medical care and may be life threatening.  °CAUSES °Almost all URIs are caused by viruses. A virus is a type of germ and can spread from one person to another.  °RISKS FACTORS °You may be at risk for a URI if:  °· You smoke.   °· You have chronic heart or lung disease. °· You have a weakened defense (immune) system.   °· You are very young or very old.   °· You have nasal allergies or asthma. °· You work in crowded or poorly ventilated areas. °· You work in health care facilities or schools. °SIGNS AND SYMPTOMS  °Symptoms typically develop 2-3 days after you come in contact with a cold virus. Most viral URIs last 7-10 days. However, viral URIs from the influenza virus (flu virus) can last 14-18 days and are typically more severe. Symptoms may include:  °· Runny or stuffy (congested) nose.   °· Sneezing.   °· Cough.   °· Sore throat.   °· Headache.   °· Fatigue.   °· Fever.   °· Loss of appetite.   °· Pain in your forehead, behind your eyes, and over your cheekbones  (sinus pain). °· Muscle aches.   °DIAGNOSIS  °Your health care provider may diagnose a URI by: °· Physical exam. °· Tests to check that your symptoms are not due to another condition such as: °¨ Strep throat. °¨ Sinusitis. °¨ Pneumonia. °¨ Asthma. °TREATMENT  °A URI goes away on its own with time. It cannot be cured with medicines, but medicines may be prescribed or recommended to relieve symptoms. Medicines may help: °· Reduce your fever. °· Reduce your cough. °· Relieve nasal congestion. °HOME CARE INSTRUCTIONS  °· Take medicines only as directed by your health care provider.   °· Gargle warm saltwater or take cough drops to comfort your throat as directed by your health care provider. °· Use a warm mist humidifier or inhale steam from a shower to increase air moisture. This may make it easier to breathe. °· Drink enough fluid to keep your urine clear or pale yellow.   °· Eat soups and other clear broths and maintain good nutrition.   °· Rest as needed.   °· Return to work when your temperature has returned to normal   or as your health care provider advises. You may need to stay home longer to avoid infecting others. You can also use a face mask and careful hand washing to prevent spread of the virus.  Increase the usage of your inhaler if you have asthma.   Do not use any tobacco products, including cigarettes, chewing tobacco, or electronic cigarettes. If you need help quitting, ask your health care provider. PREVENTION  The best way to protect yourself from getting a cold is to practice good hygiene.   Avoid oral or hand contact with people with cold symptoms.   Wash your hands often if contact occurs.  There is no clear evidence that vitamin C, vitamin E, echinacea, or exercise reduces the chance of developing a cold. However, it is always recommended to get plenty of rest, exercise, and practice good nutrition.  SEEK MEDICAL CARE IF:   You are getting worse rather than better.   Your  symptoms are not controlled by medicine.   You have chills.  You have worsening shortness of breath.  You have brown or red mucus.  You have yellow or brown nasal discharge.  You have pain in your face, especially when you bend forward.  You have a fever.  You have swollen neck glands.  You have pain while swallowing.  You have white areas in the back of your throat. SEEK IMMEDIATE MEDICAL CARE IF:   You have severe or persistent:  Headache.  Ear pain.  Sinus pain.  Chest pain.  You have chronic lung disease and any of the following:  Wheezing.  Prolonged cough.  Coughing up blood.  A change in your usual mucus.  You have a stiff neck.  You have changes in your:  Vision.  Hearing.  Thinking.  Mood. MAKE SURE YOU:   Understand these instructions.  Will watch your condition.  Will get help right away if you are not doing well or get worse.   This information is not intended to replace advice given to you by your health care provider. Make sure you discuss any questions you have with your health care provider.   Document Released: 05/27/2001 Document Revised: 04/17/2015 Document Reviewed: 03/08/2014 Elsevier Interactive Patient Education 2016 [employer].  Smoking Cessation, Tips for Success If you are ready to quit smoking, congratulations! You have chosen to help yourself be healthier. Cigarettes bring nicotine, tar, carbon monoxide, and other irritants into your body. Your lungs, heart, and blood vessels will be able to work better without these poisons. There are many different ways to quit smoking. Nicotine gum, nicotine patches, a nicotine inhaler, or nicotine nasal spray can help with physical craving. Hypnosis, support groups, and medicines help break the habit of smoking. WHAT THINGS CAN I DO TO MAKE QUITTING EASIER?  Here are some tips to help you quit for good:  Pick a date when you will quit smoking completely. Tell all of your friends  and family about your plan to quit on that date.  Do not try to slowly cut down on the number of cigarettes you are smoking. Pick a quit date and quit smoking completely starting on that day.  Throw away all cigarettes.   Clean and remove all ashtrays from your home, work, and car.  On a card, write down your reasons for quitting. Carry the card with you and read it when you get the urge to smoke.  Cleanse your body of nicotine. Drink enough water and fluids to keep your urine clear  or pale yellow. Do this after quitting to flush the nicotine from your body.  Learn to predict your moods. Do not let a bad situation be your excuse to have a cigarette. Some situations in your life might tempt you into wanting a cigarette.  Never have "just one" cigarette. It leads to wanting another and another. Remind yourself of your decision to quit.  Change habits associated with smoking. If you smoked while driving or when feeling stressed, try other activities to replace smoking. Stand up when drinking your coffee. Brush your teeth after eating. Sit in a different chair when you read the paper. Avoid alcohol while trying to quit, and try to drink fewer caffeinated beverages. Alcohol and caffeine may urge you to smoke.  Avoid foods and drinks that can trigger a desire to smoke, such as sugary or spicy foods and alcohol.  Ask people who smoke not to smoke around you.  Have something planned to do right after eating or having a cup of coffee. For example, plan to take a walk or exercise.  Try a relaxation exercise to calm you down and decrease your stress. Remember, you may be tense and nervous for the first 2 weeks after you quit, but this will pass.  Find new activities to keep your hands busy. Play with a pen, coin, or rubber band. Doodle or draw things on paper.  Brush your teeth right after eating. This will help cut down on the craving for the taste of tobacco after meals. You can also try  mouthwash.   Use oral substitutes in place of cigarettes. Try using lemon drops, carrots, cinnamon sticks, or chewing gum. Keep them handy so they are available when you have the urge to smoke.  When you have the urge to smoke, try deep breathing.  Designate your home as a nonsmoking area.  If you are a heavy smoker, ask your health care provider about a prescription for nicotine chewing gum. It can ease your withdrawal from nicotine.  Reward yourself. Set aside the cigarette money you save and buy yourself something nice.  Look for support from others. Join a support group or smoking cessation program. Ask someone at home or at work to help you with your plan to quit smoking.  Always ask yourself, "Do I need this cigarette or is this just a reflex?" Tell yourself, "Today, I choose not to smoke," or "I do not want to smoke." You are reminding yourself of your decision to quit.  Do not replace cigarette smoking with electronic cigarettes (commonly called e-cigarettes). The safety of e-cigarettes is unknown, and some may contain harmful chemicals.  If you relapse, do not give up! Plan ahead and think about what you will do the next time you get the urge to smoke. HOW WILL I FEEL WHEN I QUIT SMOKING? You may have symptoms of withdrawal because your body is used to nicotine (the addictive substance in cigarettes). You may crave cigarettes, be irritable, feel very hungry, cough often, get headaches, or have difficulty concentrating. The withdrawal symptoms are only temporary. They are strongest when you first quit but will go away within 10-14 days. When withdrawal symptoms occur, stay in control. Think about your reasons for quitting. Remind yourself that these are signs that your body is healing and getting used to being without cigarettes. Remember that withdrawal symptoms are easier to treat than the major diseases that smoking can cause.  Even after the withdrawal is over, expect periodic urges  to smoke.  However, these cravings are generally short lived and will go away whether you smoke or not. Do not smoke! WHAT RESOURCES ARE AVAILABLE TO HELP ME QUIT SMOKING? Your health care provider can direct you to community resources or hospitals for support, which may include:  Group support.  Education.  Hypnosis.  Therapy.   This information is not intended to replace advice given to you by your health care provider. Make sure you discuss any questions you have with your health care provider.   Document Released: 08/29/2004 Document Revised: 12/22/2014 Document Reviewed: 05/19/2013 Elsevier Interactive Patient Education 2016 [employer].  Steps to Quit Smoking  Smoking tobacco can be harmful to your health and can affect almost every organ in your body. Smoking puts you, and those around you, at risk for developing many serious chronic diseases. Quitting smoking is difficult, but it is one of the best things that you can do for your health. It is never too late to quit. WHAT ARE THE BENEFITS OF QUITTING SMOKING? When you quit smoking, you lower your risk of developing serious diseases and conditions, such as:  Lung cancer or lung disease, such as COPD.  Heart disease.  Stroke.  Heart attack.  Infertility.  Osteoporosis and bone fractures. Additionally, symptoms such as coughing, wheezing, and shortness of breath may get better when you quit. You may also find that you get sick less often because your body is stronger at fighting off colds and infections. If you are pregnant, quitting smoking can help to reduce your chances of having a baby of low birth weight. HOW DO I GET READY TO QUIT? When you decide to quit smoking, create a plan to make sure that you are successful. Before you quit:  Pick a date to quit. Set a date within the next two weeks to give you time to prepare.  Write down the reasons why you are quitting. Keep this list in places where you will see it often,  such as on your bathroom mirror or in your car or wallet.  Identify the people, places, things, and activities that make you want to smoke (triggers) and avoid them. Make sure to take these actions:  Throw away all cigarettes at home, at work, and in your car.  Throw away smoking accessories, such as Set designer.  Clean your car and make sure to empty the ashtray.  Clean your home, including curtains and carpets.  Tell your family, friends, and coworkers that you are quitting. Support from your loved ones can make quitting easier.  Talk with your health care provider about your options for quitting smoking.  Find out what treatment options are covered by your health insurance. WHAT STRATEGIES CAN I USE TO QUIT SMOKING?  Talk with your healthcare provider about different strategies to quit smoking. Some strategies include:  Quitting smoking altogether instead of gradually lessening how much you smoke over a period of time. Research shows that quitting "cold [place]" is more successful than gradually quitting.  Attending in-person counseling to help you build problem-solving skills. You are more likely to have success in quitting if you attend several counseling sessions. Even short sessions of 10 minutes can be effective.  Finding resources and support systems that can help you to quit smoking and remain smoke-free after you quit. These resources are most helpful when you use them often. They can include:  Online chats with a Veterinary surgeon.  Telephone quitlines.  Printed Materials engineer.  Support groups or group counseling.  Text  messaging programs.  Mobile phone applications.  Taking medicines to help you quit smoking. (If you are pregnant or breastfeeding, talk with your health care provider first.) Some medicines contain nicotine and some do not. Both types of medicines help with cravings, but the medicines that include nicotine help to relieve withdrawal symptoms.  Your health care provider may recommend:  Nicotine patches, gum, or lozenges.  Nicotine inhalers or sprays.  Non-nicotine medicine that is taken by mouth. Talk with your health care provider about combining strategies, such as taking medicines while you are also receiving in-person counseling. Using these two strategies together makes you more likely to succeed in quitting than if you used either strategy on its own. If you are pregnant or breastfeeding, talk with your health care provider about finding counseling or other support strategies to quit smoking. Do not take medicine to help you quit smoking unless told to do so by your health care provider. WHAT THINGS CAN I DO TO MAKE IT EASIER TO QUIT? Quitting smoking might feel overwhelming at first, but there is a lot that you can do to make it easier. Take these important actions:  Reach out to your family and friends and ask that they support and encourage you during this time. Call telephone quitlines, reach out to support groups, or work with a counselor for support.  Ask people who smoke to avoid smoking around you.  Avoid places that trigger you to smoke, such as bars, parties, or smoke-break areas at work.  Spend time around people who do not smoke.  Lessen stress in your life, because stress can be a smoking trigger for some people. To lessen stress, try:  Exercising regularly.  Deep-breathing exercises.  Yoga.  Meditating.  Performing a body scan. This involves closing your eyes, scanning your body from head to toe, and noticing which parts of your body are particularly tense. Purposefully relax the muscles in those areas.  Download or purchase mobile phone or tablet apps (applications) that can help you stick to your quit plan by providing reminders, tips, and encouragement. There are many free apps, such as QuitGuide from the [employer] developer(Centers for Disease Control and Prevention). You can find other support for quitting smoking  (smoking cessation) through smokefree.gov and other websites. HOW WILL I FEEL WHEN I QUIT SMOKING? Within the first 24 hours of quitting smoking, you may start to feel some withdrawal symptoms. These symptoms are usually most noticeable 2-3 days after quitting, but they usually do not last beyond 2-3 weeks. Changes or symptoms that you might experience include:  Mood swings.  Restlessness, anxiety, or irritation.  Difficulty concentrating.  Dizziness.  Strong cravings for sugary foods in addition to nicotine.  Mild weight gain.  Constipation.  Nausea.  Coughing or a sore throat.  Changes in how your medicines work in your body.  A depressed mood.  Difficulty sleeping (insomnia). After the first 2-3 weeks of quitting, you may start to notice more positive results, such as:  Improved sense of smell and taste.  Decreased coughing and sore throat.  Slower heart rate.  Lower blood pressure.  Clearer skin.  The ability to breathe more easily.  Fewer sick days. Quitting smoking is very challenging for most people. Do not get discouraged if you are not successful the first time. Some people need to make many attempts to quit before they achieve long-term success. Do your best to stick to your quit plan, and talk with your health care provider if you have  any questions or concerns.   This information is not intended to replace advice given to you by your health care provider. Make sure you discuss any questions you have with your health care provider.   Document Released: 11/25/2001 Document Revised: 04/17/2015 Document Reviewed: 04/17/2015 Elsevier Interactive Patient Education [employer]

## 2016-06-30 NOTE — ED Notes (Signed)
Cough, congestion and laryngitis x 3 days.
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
## 2016-07-16 ENCOUNTER — Ambulatory Visit (HOSPITAL_COMMUNITY)
Admission: RE | Admit: 2016-07-16 | Discharge: 2016-07-16 | Disposition: A | Discharge status: Home / Self Care | Payer: Medicaid Other | Source: Ambulatory Visit | Attending: Family | Admitting: Family

## 2016-07-16 ENCOUNTER — Other Ambulatory Visit (HOSPITAL_COMMUNITY): Payer: Self-pay | Admitting: Family

## 2016-07-16 DIAGNOSIS — M25512 Pain in left shoulder: Secondary | ICD-10-CM

## 2016-11-26 ENCOUNTER — Encounter: Payer: Self-pay | Admitting: Adult Health

## 2016-11-26 ENCOUNTER — Ambulatory Visit (INDEPENDENT_AMBULATORY_CARE_PROVIDER_SITE_OTHER): Payer: Medicaid Other | Admitting: Adult Health

## 2016-11-26 VITALS — BP 100/60 | HR 78 | Ht 62.0 in | Wt 104.0 lb

## 2016-11-26 DIAGNOSIS — B9689 Other specified bacterial agents as the cause of diseases classified elsewhere: Secondary | ICD-10-CM | POA: Diagnosis not present

## 2016-11-26 DIAGNOSIS — N760 Acute vaginitis: Secondary | ICD-10-CM

## 2016-11-26 DIAGNOSIS — N898 Other specified noninflammatory disorders of vagina: Secondary | ICD-10-CM | POA: Diagnosis not present

## 2016-11-26 LAB — POCT WET PREP (WET MOUNT): CLUE CELLS WET PREP WHIFF POC: POSITIVE

## 2016-11-26 MED ORDER — METRONIDAZOLE 500 MG PO TABS: 500.0000 mg | ORAL_TABLET | Freq: Two times a day (BID) | ORAL | 0 refills | Status: DC

## 2016-11-26 MED ORDER — ESTROGENS, CONJUGATED 0.625 MG/GM VA CREA: TOPICAL_CREAM | VAGINAL | 2 refills | Status: DC

## 2016-11-26 NOTE — Progress Notes (Signed)
Subjective:     Patient ID: Tiffany Barry, female   DOB: August 08, 1971, 45 y.o.   MRN: [id number]  HPI Tiffany Barry is a 45 year old white female in complaining of vaginal discharge, with some itching, no odor, and ?rectocele.Husband cheated.   Review of Systems +vaginal discharge with some itching, no odor ?rectocele  Reviewed past medical,surgical, social and family history. Reviewed medications and allergies.     Objective:   Physical Exam BP 100/60 (BP Location: Left Arm, Patient Position: Sitting, Cuff Size: Normal)   Pulse 78   Ht 5\' 2"  (1.575 m)   Wt 104 lb (47.2 kg)   LMP 06/20/2011   BMI 19.02 kg/m    PHQ 2 score 1. Skin warm and dry.Pelvic: external genitalia is normal in appearance no lesions, vagina: white discharge with odor,urethra has no lesions or masses noted, cervix and uterus are absent, adnexa: no masses or tenderness noted. Bladder is non tender and no masses felt. Wet prep: + for clue cells and +WBCs. GC/CHL obtained.  On rectal exam has small hemorrhoid, no reoccurrence of rectocele. Declines HIV at present.  Assessment:     1. Vaginal discharge   2. BV (bacterial vaginosis)   3. Vaginal dryness       Plan:     GC/CHL sent Meds ordered this encounter  Medications  . conjugated estrogens (PREMARIN) vaginal cream    Sig: INSERT 1 GRAM VAGINALLY AT BEDTIME FOR 2 WEEKS, THEN CAN USE 2-3 WEEKLY    Dispense:  30 g    Refill:  2    Order Specific Question:   Supervising Provider    Answer:   EURE, LUTHER H [[id number]]  . metroNIDAZOLE (FLAGYL) 500 MG tablet    Sig: Take 1 tablet (500 mg total) by mouth 2 (two) times daily.    Dispense:  14 tablet    Refill:  0    Order Specific Question:   Supervising Provider    Answer:   Duane LopeEURE, LUTHER H [[id number]]  Follow up prn No sex or alcohol during treatment

## 2016-11-26 NOTE — Patient Instructions (Signed)
No alcohol or sex Follow up prn

## 2016-11-28 LAB — GC/CHLAMYDIA PROBE AMP
Chlamydia trachomatis, NAA: NEGATIVE
Neisseria gonorrhoeae by PCR: NEGATIVE

## 2016-12-12 ENCOUNTER — Telehealth: Payer: Self-pay | Admitting: Adult Health

## 2016-12-12 NOTE — Telephone Encounter (Signed)
[facility] Pt states she called the hospital and that a doctor would have to schedule the appointment. Please contact pt

## 2016-12-16 ENCOUNTER — Ambulatory Visit (INDEPENDENT_AMBULATORY_CARE_PROVIDER_SITE_OTHER): Payer: Medicaid Other | Admitting: Adult Health

## 2016-12-16 ENCOUNTER — Encounter: Payer: Self-pay | Admitting: Adult Health

## 2016-12-16 VITALS — BP 106/61 | HR 74 | Ht 62.0 in | Wt 103.5 lb

## 2016-12-16 DIAGNOSIS — N6311 Unspecified lump in the right breast, upper outer quadrant: Secondary | ICD-10-CM

## 2016-12-16 DIAGNOSIS — N6310 Unspecified lump in the right breast, unspecified quadrant: Secondary | ICD-10-CM

## 2016-12-16 NOTE — Patient Instructions (Signed)
[facility]

## 2016-12-16 NOTE — Telephone Encounter (Signed)
Has tender breast lump to come in at 2 pm today to assess

## 2016-12-16 NOTE — Progress Notes (Signed)
Subjective:     Patient ID: Tiffany Barry, female   DOB: [birth year], 46 y.o.   MRN: [id number]  HPI Tiffany Barry is a 46 year old white female in complaining of tender lump in right breast x 1 week.   Review of Systems + tender lump in right breast x 1 week    Reviewed past medical,surgical, social and family history. Reviewed medications and allergies.  Objective:   Physical Exam BP 106/61 (BP Location: Left Arm, Patient Position: Sitting, Cuff Size: Normal)   Pulse 74   Ht 5\' 2"  (1.575 m)   Wt 103 lb 8 oz (46.9 kg)   LMP 06/20/2011   BMI 18.93 kg/m PHQ 2 score 1.  Skin warm and dry,  Breasts:no dominate palpable mass, retraction or nipple discharge on left, no retraction or nipple discharge but has tender,mobile 3 cm mass at 10-11 o'clock on right breast.    Assessment:     1. Breast mass, right       Plan:     Bilateral diagnostic mammogram and [facility] Follow up prn

## 2016-12-23 ENCOUNTER — Ambulatory Visit (HOSPITAL_COMMUNITY)
Admission: RE | Admit: 2016-12-23 | Discharge: 2016-12-23 | Disposition: A | Discharge status: Home / Self Care | Payer: Medicaid Other | Source: Ambulatory Visit | Attending: Adult Health | Admitting: Adult Health

## 2016-12-23 DIAGNOSIS — N6310 Unspecified lump in the right breast, unspecified quadrant: Secondary | ICD-10-CM

## 2016-12-23 DIAGNOSIS — N6001 Solitary cyst of right breast: Secondary | ICD-10-CM | POA: Diagnosis not present

## 2017-01-16 ENCOUNTER — Ambulatory Visit (INDEPENDENT_AMBULATORY_CARE_PROVIDER_SITE_OTHER): Payer: Medicaid Other | Admitting: Adult Health

## 2017-01-16 ENCOUNTER — Encounter: Payer: Self-pay | Admitting: Adult Health

## 2017-01-16 VITALS — BP 92/60 | HR 88 | Ht 60.0 in | Wt 103.5 lb

## 2017-01-16 DIAGNOSIS — Z0000 Encounter for general adult medical examination without abnormal findings: Secondary | ICD-10-CM

## 2017-01-16 DIAGNOSIS — N898 Other specified noninflammatory disorders of vagina: Secondary | ICD-10-CM

## 2017-01-16 DIAGNOSIS — Z1211 Encounter for screening for malignant neoplasm of colon: Secondary | ICD-10-CM | POA: Diagnosis not present

## 2017-01-16 DIAGNOSIS — Z872 Personal history of diseases of the skin and subcutaneous tissue: Secondary | ICD-10-CM | POA: Insufficient documentation

## 2017-01-16 DIAGNOSIS — Z01419 Encounter for gynecological examination (general) (routine) without abnormal findings: Secondary | ICD-10-CM | POA: Diagnosis not present

## 2017-01-16 DIAGNOSIS — N816 Rectocele: Secondary | ICD-10-CM | POA: Insufficient documentation

## 2017-01-16 DIAGNOSIS — Z1212 Encounter for screening for malignant neoplasm of rectum: Secondary | ICD-10-CM

## 2017-01-16 LAB — HEMOCCULT GUIAC POC 1CARD (OFFICE): FECAL OCCULT BLD: NEGATIVE

## 2017-01-16 NOTE — Progress Notes (Signed)
Patient ID: Tiffany Barry, female   DOB: March 06, 1971, 46 y.o.   MRN: [id number] History of Present Illness: [name] is a 46 year old white female in for a well woman gyn exam, she is sp hysterectomy. PCP is [facility]   Current Medications, Allergies, Past Medical History, Past Surgical History, Family History and Social History were [facility].     Review of Systems: Patient denies any hearing loss, fatigue, blurred vision, shortness of breath, chest pain, abdominal pain, problems with bowel movements, urination, or intercourse. No joint pain or mood swings. Has stress headaches.   Physical Exam:BP 92/60 (BP Location: Left Arm, Patient Position: Sitting, Cuff Size: Normal)   Pulse 88   Ht 5' (1.524 m)   Wt 103 lb 8 oz (46.9 kg)   LMP 06/20/2011   BMI 20.21 kg/m  General:  Well developed, well nourished, no acute distress Skin:  Warm and dry Neck:  Midline trachea, normal thyroid, good ROM, no lymphadenopathy Lungs; Clear to auscultation bilaterally Breast:  No dominant palpable mass, retraction, or nipple discharge,cyst right breast seems to be resolving,non tender Cardiovascular: Regular rate and rhythm Abdomen:  Soft, non tender, no hepatosplenomegaly Pelvic:  External genitalia is normal in appearance, no lesions.  The vagina is normal in appearance. Urethra has no lesions or masses. The cervix and uterus are absent.  No adnexal masses or tenderness noted.Bladder is non tender, no masses felt. Rectal: Good sphincter tone, no polyps, or hemorrhoids felt.  Hemoccult negative.+rectocele Extremities/musculoskeletal:  No swelling or varicosities noted, no clubbing or cyanosis Psych:  No mood changes, alert and cooperative,seems happy PHQ 2 score 1  Impression: 1. Well woman exam with routine gynecological exam   2. Vaginal dryness   3. Screening for colorectal cancer   4. History of cyst of breast   5.      Rectocele    Plan: Continue  estrogen cream 2-3 x weekly Physical in 1 year Mammogram yearly Check CBC,CMP,TSH and lipids

## 2017-01-17 LAB — TSH: TSH: 0.56 u[IU]/mL (ref 0.450–4.500)

## 2017-01-17 LAB — CBC
Hematocrit: 39.6 % (ref 34.0–46.6)
Hemoglobin: 13.3 g/dL (ref 11.1–15.9)
MCH: 33.1 pg — AB (ref 26.6–33.0)
MCHC: 33.6 g/dL (ref 31.5–35.7)
MCV: 99 fL — ABNORMAL HIGH (ref 79–97)
PLATELETS: 265 10*3/uL (ref 150–379)
RBC: 4.02 x10E6/uL (ref 3.77–5.28)
RDW: 13.9 % (ref 12.3–15.4)
WBC: 7.4 10*3/uL (ref 3.4–10.8)

## 2017-01-17 LAB — COMPREHENSIVE METABOLIC PANEL
ALT: 10 IU/L (ref 0–32)
AST: 18 IU/L (ref 0–40)
Albumin/Globulin Ratio: 1.8 (ref 1.2–2.2)
Albumin: 4.3 g/dL (ref 3.5–5.5)
Alkaline Phosphatase: 88 IU/L (ref 39–117)
BUN/Creatinine Ratio: 8 — ABNORMAL LOW (ref 9–23)
BUN: 6 mg/dL (ref 6–24)
CHLORIDE: 99 mmol/L (ref 96–106)
CO2: 22 mmol/L (ref 18–29)
Calcium: 8.9 mg/dL (ref 8.7–10.2)
Creatinine, Ser: 0.72 mg/dL (ref 0.57–1.00)
GFR calc non Af Amer: 100 mL/min/{1.73_m2} (ref >59)
GFR, EST AFRICAN AMERICAN: 115 mL/min/{1.73_m2} (ref >59)
GLUCOSE: 88 mg/dL (ref 65–99)
Globulin, Total: 2.4 g/dL (ref 1.5–4.5)
Potassium: 4.6 mmol/L (ref 3.5–5.2)
Sodium: 141 mmol/L (ref 134–144)
TOTAL PROTEIN: 6.7 g/dL (ref 6.0–8.5)

## 2017-01-17 LAB — LIPID PANEL
CHOL/HDL RATIO: 2.8 ratio (ref 0.0–4.4)
Cholesterol, Total: 184 mg/dL (ref 100–199)
HDL: 66 mg/dL (ref >39)
LDL Calculated: 98 mg/dL (ref 0–99)
TRIGLYCERIDES: 100 mg/dL (ref 0–149)
VLDL Cholesterol Cal: 20 mg/dL (ref 5–40)

## 2017-02-12 DIAGNOSIS — M5416 Radiculopathy, lumbar region: Secondary | ICD-10-CM | POA: Diagnosis not present

## 2017-02-12 DIAGNOSIS — G8929 Other chronic pain: Secondary | ICD-10-CM | POA: Diagnosis not present

## 2017-02-12 DIAGNOSIS — M545 Low back pain: Secondary | ICD-10-CM | POA: Diagnosis not present

## 2017-02-12 DIAGNOSIS — M533 Sacrococcygeal disorders, not elsewhere classified: Secondary | ICD-10-CM | POA: Diagnosis not present

## 2017-05-15 ENCOUNTER — Emergency Department (HOSPITAL_COMMUNITY)
Admission: EM | Admit: 2017-05-15 | Discharge: 2017-05-15 | Disposition: A | Discharge status: Home / Self Care | Payer: Medicaid Other | Attending: Emergency Medicine | Admitting: Emergency Medicine

## 2017-05-15 ENCOUNTER — Encounter (HOSPITAL_COMMUNITY): Payer: Self-pay

## 2017-05-15 DIAGNOSIS — F17210 Nicotine dependence, cigarettes, uncomplicated: Secondary | ICD-10-CM | POA: Diagnosis not present

## 2017-05-15 DIAGNOSIS — Z79899 Other long term (current) drug therapy: Secondary | ICD-10-CM | POA: Diagnosis not present

## 2017-05-15 DIAGNOSIS — J45909 Unspecified asthma, uncomplicated: Secondary | ICD-10-CM | POA: Diagnosis not present

## 2017-05-15 DIAGNOSIS — R21 Rash and other nonspecific skin eruption: Secondary | ICD-10-CM | POA: Diagnosis present

## 2017-05-15 DIAGNOSIS — B86 Scabies: Secondary | ICD-10-CM | POA: Diagnosis not present

## 2017-05-15 MED ORDER — PERMETHRIN 5 % EX CREA: TOPICAL_CREAM | CUTANEOUS | 0 refills | Status: DC

## 2017-05-15 MED ORDER — HYDROXYZINE HCL 25 MG PO TABS: 25.0000 mg | ORAL_TABLET | Freq: Four times a day (QID) | ORAL | 0 refills | Status: DC | PRN

## 2017-05-15 NOTE — ED Triage Notes (Signed)
Rash to legs and buttocks x 1 week, states she does not know the cause, using benadryl lotion topically without relief.
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
## 2017-09-04 ENCOUNTER — Encounter: Payer: Self-pay | Admitting: Cardiovascular Disease
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
## 2017-09-22 ENCOUNTER — Ambulatory Visit (INDEPENDENT_AMBULATORY_CARE_PROVIDER_SITE_OTHER): Payer: Medicaid Other | Admitting: Cardiovascular Disease

## 2017-09-22 ENCOUNTER — Encounter: Payer: Self-pay | Admitting: Cardiovascular Disease

## 2017-09-22 VITALS — BP 120/86 | HR 85 | Ht 62.0 in | Wt 104.0 lb

## 2017-09-22 DIAGNOSIS — R079 Chest pain, unspecified: Secondary | ICD-10-CM

## 2017-09-22 NOTE — Patient Instructions (Addendum)
Medication Instructions:  Your physician recommends that you continue on your current medications as directed. Please refer to the Current Medication list given to you today.   Labwork: NONE  Testing/Procedures: Your physician has requested that you have an exercise tolerance test. For further information please visit [email or web address]. Please also follow instruction sheet, as given.    Follow-Up: Your physician recommends that you schedule a follow-up appointment in: AS NEEDED    Any Other Special Instructions Will Be Listed Below (If Applicable).     If you need a refill on your cardiac medications before your next appointment, please call your pharmacy.   

## 2017-10-01 ENCOUNTER — Ambulatory Visit (HOSPITAL_COMMUNITY)
Admission: RE | Admit: 2017-10-01 | Discharge: 2017-10-01 | Disposition: A | Discharge status: Home / Self Care | Payer: Medicaid Other | Source: Ambulatory Visit | Attending: Cardiovascular Disease | Admitting: Cardiovascular Disease

## 2017-10-01 DIAGNOSIS — R079 Chest pain, unspecified: Secondary | ICD-10-CM | POA: Diagnosis not present

## 2017-10-01 DIAGNOSIS — R9431 Abnormal electrocardiogram [ECG] [EKG]: Secondary | ICD-10-CM | POA: Diagnosis not present

## 2017-10-01 DIAGNOSIS — R0789 Other chest pain: Secondary | ICD-10-CM

## 2017-10-01 LAB — EXERCISE TOLERANCE TEST
CSEPEDS: 20 s
CSEPEW: 10.1 METS
CSEPHR: 71 %
Exercise duration (min): 7 min
MPHR: 172 {beats}/min
Peak HR: 123 {beats}/min
RPE: 16
Rest HR: 72 {beats}/min

## 2017-10-05 ENCOUNTER — Encounter: Payer: Self-pay | Admitting: Cardiovascular Disease

## 2017-10-06 ENCOUNTER — Telehealth: Payer: Self-pay

## 2017-10-06 DIAGNOSIS — R079 Chest pain, unspecified: Secondary | ICD-10-CM

## 2017-10-06 NOTE — Telephone Encounter (Signed)
-----   Message from Wendall StadePeter C Nishan, MD sent at 10/05/2017  5:32 PM EDT ----- Poor exercise tolerance needs f/u lexiscan myovue

## 2017-10-06 NOTE — Telephone Encounter (Signed)
lexiscan scheduled for 10/16/17 arrive 8 am main desk registration, [facility], NPO after midnight, may take meds with water.no caffeine 6 hrs prior, no smoking also.Pt verbalized understanding

## 2017-10-16 ENCOUNTER — Encounter (HOSPITAL_COMMUNITY): Payer: Self-pay

## 2017-10-16 ENCOUNTER — Encounter (HOSPITAL_BASED_OUTPATIENT_CLINIC_OR_DEPARTMENT_OTHER)
Admission: RE | Admit: 2017-10-16 | Discharge: 2017-10-16 | Disposition: A | Discharge status: Home / Self Care | Payer: Medicaid Other | Source: Ambulatory Visit | Attending: Cardiovascular Disease | Admitting: Cardiovascular Disease

## 2017-10-16 ENCOUNTER — Other Ambulatory Visit: Payer: Self-pay | Admitting: Cardiovascular Disease

## 2017-10-16 ENCOUNTER — Encounter (HOSPITAL_COMMUNITY)
Admission: RE | Admit: 2017-10-16 | Discharge: 2017-10-16 | Disposition: A | Discharge status: Home / Self Care | Payer: Medicaid Other | Source: Ambulatory Visit | Attending: Cardiovascular Disease | Admitting: Cardiovascular Disease

## 2017-10-16 DIAGNOSIS — R079 Chest pain, unspecified: Secondary | ICD-10-CM | POA: Diagnosis not present

## 2017-10-16 LAB — NM MYOCAR MULTI W/SPECT W/WALL MOTION / EF
CHL CUP NUCLEAR SDS: 1
CHL CUP NUCLEAR SRS: 0
CHL CUP RESTING HR STRESS: 70 {beats}/min
CSEPPHR: 125 {beats}/min
LV dias vol: 68 mL (ref 46–106)
LV sys vol: 21 mL
NUC STRESS TID: 1.3
RATE: 0.34
SSS: 1

## 2017-10-16 MED ORDER — TECHNETIUM TC 99M TETROFOSMIN IV KIT: 10.0000 | PACK | Freq: Once | INTRAVENOUS | Status: DC

## 2017-10-16 MED ORDER — SODIUM CHLORIDE 0.9% FLUSH
INTRAVENOUS | Status: AC
  Administered 2017-10-16: 10 mL via INTRAVENOUS
  Filled 2017-10-16: qty 10

## 2017-10-16 MED ORDER — REGADENOSON 0.4 MG/5ML IV SOLN
INTRAVENOUS | Status: AC
  Administered 2017-10-16: 0.4 mg via INTRAVENOUS
  Filled 2017-10-16: qty 5

## 2017-10-16 MED ORDER — TECHNETIUM TC 99M TETROFOSMIN IV KIT
30.0000 | PACK | Freq: Once | INTRAVENOUS | Status: AC | PRN
  Administered 2017-10-16: 30 via INTRAVENOUS

## 2017-11-09 ENCOUNTER — Encounter: Payer: Self-pay | Admitting: Cardiovascular Disease

## 2017-11-11 ENCOUNTER — Telehealth: Payer: Self-pay

## 2017-11-11 NOTE — Telephone Encounter (Signed)
Sent note through MyChart for dentist to patient

## 2017-11-18 ENCOUNTER — Encounter: Payer: Self-pay | Admitting: Cardiovascular Disease

## 2017-12-07 ENCOUNTER — Encounter (HOSPITAL_COMMUNITY): Payer: Self-pay | Admitting: Emergency Medicine

## 2017-12-07 ENCOUNTER — Emergency Department (HOSPITAL_COMMUNITY)
Admission: EM | Admit: 2017-12-07 | Discharge: 2017-12-07 | Disposition: A | Discharge status: Home / Self Care | Payer: Medicaid Other | Attending: Emergency Medicine | Admitting: Emergency Medicine

## 2017-12-07 DIAGNOSIS — Z79899 Other long term (current) drug therapy: Secondary | ICD-10-CM | POA: Diagnosis not present

## 2017-12-07 DIAGNOSIS — Y929 Unspecified place or not applicable: Secondary | ICD-10-CM | POA: Insufficient documentation

## 2017-12-07 DIAGNOSIS — F17210 Nicotine dependence, cigarettes, uncomplicated: Secondary | ICD-10-CM | POA: Insufficient documentation

## 2017-12-07 DIAGNOSIS — J45909 Unspecified asthma, uncomplicated: Secondary | ICD-10-CM | POA: Diagnosis not present

## 2017-12-07 DIAGNOSIS — Y999 Unspecified external cause status: Secondary | ICD-10-CM | POA: Diagnosis not present

## 2017-12-07 DIAGNOSIS — Y939 Activity, unspecified: Secondary | ICD-10-CM | POA: Diagnosis not present

## 2017-12-07 DIAGNOSIS — W272XXA Contact with scissors, initial encounter: Secondary | ICD-10-CM | POA: Diagnosis not present

## 2017-12-07 DIAGNOSIS — S61412A Laceration without foreign body of left hand, initial encounter: Secondary | ICD-10-CM | POA: Insufficient documentation

## 2017-12-07 MED ORDER — LIDOCAINE-EPINEPHRINE (PF) 2 %-1:200000 IJ SOLN
10.0000 mL | Freq: Once | INTRAMUSCULAR | Status: AC
  Administered 2017-12-07: 10 mL
  Filled 2017-12-07: qty 20

## 2017-12-07 MED ORDER — POVIDONE-IODINE 10 % EX SOLN
CUTANEOUS | Status: AC
  Filled 2017-12-07: qty 15

## 2017-12-07 NOTE — ED Notes (Signed)
Pt and family member at the desk asking how much longer, states EDP was in room an hour ago and said she would be in;  This RN informed pt and her family member that the EDP would be in as soon as she could; pt very upset and states she needs to get home for her children for Christmas; pt seen leaving the department with family member stating she is going out to smoke
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
## 2017-12-07 NOTE — ED Triage Notes (Signed)
Pt states she was cutting christmas presents and punctured top of her left hand above her thumb.

## 2017-12-07 NOTE — ED Notes (Signed)
Pt ambulatory to waiting room. Pt verbalized understanding of discharge instructions.   

## 2017-12-07 NOTE — Discharge Instructions (Signed)
Keep the wound clean and dry. Elevate your hand, use ice packs for swelling and bleeding. You can hold pressure on the wound if it is bleeding. You can take ibuprofen 600 mg  + acetaminophen 650 mg every 6 hrs as needed for pain. The sutures need to be removed in 10-12 days, you can go to the health department or return here. If you continue to have numbness and pain, you can be rechecked by Dr Romeo AppleHarrison, the orthopedist on call tonight for hand problems. Call his office to get an appointment.

## 2018-04-16 ENCOUNTER — Other Ambulatory Visit: Payer: Self-pay | Admitting: Adult Health

## 2018-08-23 ENCOUNTER — Encounter: Payer: Self-pay | Admitting: Adult Health

## 2018-08-23 ENCOUNTER — Ambulatory Visit (INDEPENDENT_AMBULATORY_CARE_PROVIDER_SITE_OTHER): Payer: Medicaid Other | Admitting: Adult Health

## 2018-08-23 VITALS — BP 107/67 | HR 79 | Ht 62.0 in | Wt 107.0 lb

## 2018-08-23 DIAGNOSIS — N898 Other specified noninflammatory disorders of vagina: Secondary | ICD-10-CM | POA: Diagnosis not present

## 2018-08-23 DIAGNOSIS — B9689 Other specified bacterial agents as the cause of diseases classified elsewhere: Secondary | ICD-10-CM | POA: Diagnosis not present

## 2018-08-23 DIAGNOSIS — N3946 Mixed incontinence: Secondary | ICD-10-CM

## 2018-08-23 DIAGNOSIS — N760 Acute vaginitis: Secondary | ICD-10-CM | POA: Diagnosis not present

## 2018-08-23 LAB — POCT URINALYSIS DIPSTICK
Glucose, UA: NEGATIVE
Leukocytes, UA: NEGATIVE
NITRITE UA: NEGATIVE
PROTEIN UA: POSITIVE — AB
RBC UA: NEGATIVE

## 2018-08-23 LAB — POCT WET PREP (WET MOUNT)
Clue Cells Wet Prep Whiff POC: POSITIVE
WBC, Wet Prep HPF POC: POSITIVE

## 2018-08-23 MED ORDER — MIRABEGRON ER 25 MG PO TB24: 25.0000 mg | ORAL_TABLET | Freq: Every day | ORAL | 0 refills | Status: DC

## 2018-08-23 MED ORDER — METRONIDAZOLE 500 MG PO TABS: 500.0000 mg | ORAL_TABLET | Freq: Two times a day (BID) | ORAL | 0 refills | Status: DC

## 2018-08-23 NOTE — Patient Instructions (Signed)
Bacterial Vaginosis Bacterial vaginosis is a vaginal infection that occurs when the normal balance of bacteria in the vagina is disrupted. It results from an overgrowth of certain bacteria. This is the most common vaginal infection among women ages 15-44. Because bacterial vaginosis increases your risk for STIs (sexually transmitted infections), getting treated can help reduce your risk for chlamydia, gonorrhea, herpes, and HIV (human immunodeficiency virus). Treatment is also important for preventing complications in pregnant women, because this condition can cause an early (premature) delivery. What are the causes? This condition is caused by an increase in harmful bacteria that are normally present in small amounts in the vagina. However, the reason that the condition develops is not fully understood. What increases the risk? The following factors may make you more likely to develop this condition:  Having a new sexual partner or multiple sexual partners.  Having unprotected sex.  Douching.  Having an intrauterine device (IUD).  Smoking.  Drug and alcohol abuse.  Taking certain antibiotic medicines.  Being pregnant.  You cannot get bacterial vaginosis from toilet seats, bedding, swimming pools, or contact with objects around you. What are the signs or symptoms? Symptoms of this condition include:  Grey or white vaginal discharge. The discharge can also be watery or foamy.  A fish-like odor with discharge, especially after sexual intercourse or during menstruation.  Itching in and around the vagina.  Burning or pain with urination.  Some women with bacterial vaginosis have no signs or symptoms. How is this diagnosed? This condition is diagnosed based on:  Your medical history.  A physical exam of the vagina.  Testing a sample of vaginal fluid under a microscope to look for a large amount of bad bacteria or abnormal cells. Your health care provider may use a cotton swab  or a small wooden spatula to collect the sample.  How is this treated? This condition is treated with antibiotics. These may be given as a pill, a vaginal cream, or a medicine that is put into the vagina (suppository). If the condition comes back after treatment, a second round of antibiotics may be needed. Follow these instructions at home: Medicines  Take over-the-counter and prescription medicines only as told by your health care provider.  Take or use your antibiotic as told by your health care provider. Do not stop taking or using the antibiotic even if you start to feel better. General instructions  If you have a female sexual partner, tell her that you have a vaginal infection. She should see her health care provider and be treated if she has symptoms. If you have a female sexual partner, he does not need treatment.  During treatment: ? Avoid sexual activity until you finish treatment. ? Do not douche. ? Avoid alcohol as directed by your health care provider. ? Avoid breastfeeding as directed by your health care provider.  Drink enough water and fluids to keep your urine clear or pale yellow.  Keep the area around your vagina and rectum clean. ? Wash the area daily with warm water. ? Wipe yourself from front to back after using the toilet.  Keep all follow-up visits as told by your health care provider. This is important. How is this prevented?  Do not douche.  Wash the outside of your vagina with warm water only.  Use protection when having sex. This includes latex condoms and dental dams.  Limit how many sexual partners you have. To help prevent bacterial vaginosis, it is best to have sex with just   one partner (monogamous).  Make sure you and your sexual partner are tested for STIs.  Wear cotton or cotton-lined underwear.  Avoid wearing tight pants and pantyhose, especially during summer.  Limit the amount of alcohol that you drink.  Do not use any products that  contain nicotine or tobacco, such as cigarettes and e-cigarettes. If you need help quitting, ask your health care provider.  Do not use illegal drugs. Where to find more information:  Centers for Disease Control and Prevention: www.cdc.gov/std  American Sexual Health Association (ASHA): www.ashastd.org  U.S. Department of Health and Human Services, Office on Women's Health: www.womenshealth.gov/ or https://www.womenshealth.gov/a-z-topics/bacterial-vaginosis Contact a health care provider if:  Your symptoms do not improve, even after treatment.  You have more discharge or pain when urinating.  You have a fever.  You have pain in your abdomen.  You have pain during sex.  You have vaginal bleeding between periods. Summary  Bacterial vaginosis is a vaginal infection that occurs when the normal balance of bacteria in the vagina is disrupted.  Because bacterial vaginosis increases your risk for STIs (sexually transmitted infections), getting treated can help reduce your risk for chlamydia, gonorrhea, herpes, and HIV (human immunodeficiency virus). Treatment is also important for preventing complications in pregnant women, because the condition can cause an early (premature) delivery.  This condition is treated with antibiotic medicines. These may be given as a pill, a vaginal cream, or a medicine that is put into the vagina (suppository). This information is not intended to replace advice given to you by your health care provider. Make sure you discuss any questions you have with your health care provider. Document Released: 12/01/2005 Document Revised: 04/06/2017 Document Reviewed: 08/16/2016 Elsevier Interactive Patient Education  2018 [employer]  

## 2018-08-23 NOTE — Progress Notes (Signed)
  Subjective:     Patient ID: Tiffany Barry, female   DOB: Feb 21, 1971, 47 y.o.   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  Review of Systems +vaginal discharge with odor Urinary incontinence at times Reviewed past medical,surgical, social and family history. Reviewed medications and allergies.      Objective:   Physical Exam BP 107/67 (BP Location: Left Arm, Patient Position: Sitting, Cuff Size: Normal)   Pulse 79   Ht 5\' 2"  (1.575 m)   Wt 107 lb (48.5 kg)   LMP 06/20/2011   BMI 19.57 kg/m  urine trace protein.  Skin warm and dry.Pelvic: external genitalia is normal in appearance no lesions, vagina: white discharge with odor,urethra has no lesions or masses noted, cervix and uterus are absent, adnexa: no masses or tenderness noted. Bladder is non tender and no masses felt. Wet prep: + for clue cells and +WBCs. Will give myrbetriq to try for bladder.     Assessment:     1. BV (bacterial vaginosis)   2. Vaginal discharge   3. Vaginal odor   4. Mixed stress and urge urinary incontinence       Plan:    No alcohol, review handout on BV Meds ordered this encounter  Medications  . metroNIDAZOLE (FLAGYL) 500 MG tablet    Sig: Take 1 tablet (500 mg total) by mouth 2 (two) times daily.    Dispense:  14 tablet    Refill:  0    Order Specific Question:   Supervising Provider    Answer:   Despina Hidden, LUTHER H [[id number]]  . mirabegron ER (MYRBETRIQ) 25 MG TB24 tablet    Sig: Take 1 tablet (25 mg total) by mouth daily.    Dispense:  28 tablet    Refill:  0    Order Specific Question:   Supervising Provider    Answer:   Duane Lope H [[id number]]  F/U prn

## 2018-10-01 ENCOUNTER — Other Ambulatory Visit: Payer: Self-pay | Admitting: Adult Health

## 2018-10-01 MED ORDER — MIRABEGRON ER 25 MG PO TB24
25.0000 mg | ORAL_TABLET | Freq: Every day | ORAL | 6 refills | Status: DC
Start: 2018-10-01 — End: 2018-10-18

## 2018-10-01 NOTE — Progress Notes (Signed)
rx myrbetriq

## 2018-10-18 ENCOUNTER — Other Ambulatory Visit: Payer: Self-pay | Admitting: Adult Health

## 2018-10-18 MED ORDER — SOLIFENACIN SUCCINATE 10 MG PO TABS
10.0000 mg | ORAL_TABLET | Freq: Every day | ORAL | 3 refills | Status: DC
Start: 2018-10-18 — End: 2018-12-24

## 2018-10-18 NOTE — Progress Notes (Signed)
Will rx vesicare

## 2018-11-08 ENCOUNTER — Other Ambulatory Visit: Payer: Medicaid Other | Admitting: Adult Health

## 2018-12-24 ENCOUNTER — Encounter: Payer: Self-pay | Admitting: Adult Health

## 2018-12-24 ENCOUNTER — Ambulatory Visit (INDEPENDENT_AMBULATORY_CARE_PROVIDER_SITE_OTHER): Payer: Medicaid Other | Admitting: Adult Health

## 2018-12-24 VITALS — BP 128/68 | HR 77 | Ht 60.0 in | Wt 102.5 lb

## 2018-12-24 DIAGNOSIS — N3946 Mixed incontinence: Secondary | ICD-10-CM

## 2018-12-24 DIAGNOSIS — Z1211 Encounter for screening for malignant neoplasm of colon: Secondary | ICD-10-CM | POA: Diagnosis not present

## 2018-12-24 DIAGNOSIS — Z1212 Encounter for screening for malignant neoplasm of rectum: Secondary | ICD-10-CM | POA: Diagnosis not present

## 2018-12-24 DIAGNOSIS — Z0000 Encounter for general adult medical examination without abnormal findings: Secondary | ICD-10-CM | POA: Diagnosis not present

## 2018-12-24 DIAGNOSIS — Z01419 Encounter for gynecological examination (general) (routine) without abnormal findings: Secondary | ICD-10-CM

## 2018-12-24 LAB — HEMOCCULT GUIAC POC 1CARD (OFFICE): Fecal Occult Blood, POC: NEGATIVE

## 2018-12-24 MED ORDER — ALBUTEROL SULFATE HFA 108 (90 BASE) MCG/ACT IN AERS: 2.0000 | INHALATION_SPRAY | Freq: Every day | RESPIRATORY_TRACT | 3 refills | Status: DC | PRN

## 2018-12-24 MED ORDER — SOLIFENACIN SUCCINATE 10 MG PO TABS: 10.0000 mg | ORAL_TABLET | Freq: Every day | ORAL | 12 refills | Status: DC

## 2018-12-24 NOTE — Progress Notes (Signed)
Patient ID: Tiffany Barry, female   DOB: November 28, 1971, 48 y.o.   MRN: [id number] History of Present Illness: Tiffany Barry is a 48 year old white female, sp hysterectomy in for well woman gyn exam. 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 PCP is Ginette Otto NP.   Current Medications, Allergies, Past Medical History, Past Surgical History, Family History and Social History were reviewed in [employer] record.     Review of Systems: Patient denies any headaches, hearing loss, fatigue, blurred vision, shortness of breath, chest pain, abdominal pain, problems with bowel movements, urination(vesicare is great for mixed UI), or intercourse. No joint pain or mood swings. +pelvic pain that is sharp a times and has back pain that radiates at times    Physical Exam:BP 128/68 (BP Location: Left Arm, Patient Position: Sitting, Cuff Size: Normal)   Pulse 77   Ht 5' (1.524 m)   Wt 102 lb 8 oz (46.5 kg)   LMP 06/20/2011   BMI 20.02 kg/m  General:  Well developed, well nourished, no acute distress Skin:  Warm and dry Neck:  Midline trachea, normal thyroid, good ROM, no lymphadenopathy Lungs; Clear to auscultation bilaterally Breast:  No dominant palpable mass, retraction, or nipple discharge Cardiovascular: Regular rate and rhythm Abdomen:  Soft, non tender, no hepatosplenomegaly Pelvic:  External genitalia is normal in appearance, no lesions.  The vagina is normal in appearance. Urethra has no lesions or masses. The cervix and uterus are absent. No adnexal masses or tenderness noted.Bladder is non tender, no masses felt. Rectal: Good sphincter tone, no polyps, or hemorrhoids felt.  Hemoccult negative. Extremities/musculoskeletal:  No swelling or varicosities noted, no clubbing or cyanosis Psych:  No mood changes, alert and cooperative,seems happy PHQ 2 score is 0. Fall risk is low. Examination chaperoned by Malachy Mood LPN. Discussed that I expect pain is back related,  to F/U with PCP.  Impression: 1. Well woman exam with routine gynecological exam   2. Screening for colorectal cancer   3. Mixed stress and urge urinary incontinence       Plan: Meds ordered this encounter  Medications  . solifenacin (VESICARE) 10 MG tablet    Sig: Take 1 tablet (10 mg total) by mouth daily.    Dispense:  30 tablet    Refill:  12    Order Specific Question:   Supervising Provider    Answer:   Despina Hidden, LUTHER H [[id number]]  . albuterol (PROVENTIL HFA;VENTOLIN HFA) 108 (90 Base) MCG/ACT inhaler    Sig: Inhale 2 puffs into the lungs daily as needed for wheezing or shortness of breath.    Dispense:  1 Inhaler    Refill:  3    Order Specific Question:   Supervising Provider    Answer:   Despina Hidden, LUTHER H [[id number]]  Continue PVC prn Physical in 1 year Mammogram every 2 years

## 2019-02-07 ENCOUNTER — Other Ambulatory Visit: Payer: Self-pay | Admitting: Adult Health

## 2019-02-07 MED ORDER — ESTRADIOL 1 MG PO TABS: 1.0000 mg | ORAL_TABLET | Freq: Every day | ORAL | 6 refills | Status: DC

## 2019-02-07 NOTE — Progress Notes (Signed)
Rx estrace 1 mg for hot flashes

## 2019-08-03 ENCOUNTER — Telehealth: Payer: Self-pay | Admitting: Orthopaedic Surgery

## 2019-08-03 NOTE — Telephone Encounter (Signed)
Dr. Lorin Mercy reviewed notes and reports and does not think that he has anything to offer the patient.

## 2019-08-03 NOTE — Telephone Encounter (Signed)
Patient called asked if Dr Lorin Mercy had a chance to review her records that she left on Friday. Patient want to be seen by Dr Lorin Mercy for her back. The number to contact patient is [phone]

## 2019-08-04 DIAGNOSIS — R531 Weakness: Secondary | ICD-10-CM | POA: Diagnosis not present

## 2019-08-04 DIAGNOSIS — G8929 Other chronic pain: Secondary | ICD-10-CM | POA: Diagnosis not present

## 2019-08-04 DIAGNOSIS — R2990 Unspecified symptoms and signs involving the nervous system: Secondary | ICD-10-CM | POA: Diagnosis not present

## 2019-08-04 DIAGNOSIS — I639 Cerebral infarction, unspecified: Secondary | ICD-10-CM

## 2019-08-04 DIAGNOSIS — K219 Gastro-esophageal reflux disease without esophagitis: Secondary | ICD-10-CM | POA: Diagnosis not present

## 2019-08-04 DIAGNOSIS — Z9282 Status post administration of tPA (rtPA) in a different facility within the last 24 hours prior to admission to current facility: Secondary | ICD-10-CM | POA: Diagnosis not present

## 2019-08-04 DIAGNOSIS — R202 Paresthesia of skin: Secondary | ICD-10-CM | POA: Diagnosis not present

## 2019-08-04 HISTORY — DX: Cerebral infarction, unspecified: I63.9

## 2019-08-04 NOTE — Telephone Encounter (Signed)
Betsy asked me to send this to you, said she had discussed with you.

## 2019-08-05 DIAGNOSIS — R29818 Other symptoms and signs involving the nervous system: Secondary | ICD-10-CM | POA: Diagnosis not present

## 2019-08-05 DIAGNOSIS — R531 Weakness: Secondary | ICD-10-CM | POA: Diagnosis not present

## 2019-08-05 DIAGNOSIS — R202 Paresthesia of skin: Secondary | ICD-10-CM | POA: Diagnosis not present

## 2019-08-10 ENCOUNTER — Ambulatory Visit (INDEPENDENT_AMBULATORY_CARE_PROVIDER_SITE_OTHER): Payer: Medicaid Other | Admitting: Family Medicine

## 2019-08-10 ENCOUNTER — Encounter: Payer: Self-pay | Admitting: Family Medicine

## 2019-08-10 ENCOUNTER — Other Ambulatory Visit: Payer: Self-pay

## 2019-08-10 VITALS — BP 116/77 | HR 83 | Temp 98.6°F | Ht 62.0 in | Wt 97.8 lb

## 2019-08-10 DIAGNOSIS — K219 Gastro-esophageal reflux disease without esophagitis: Secondary | ICD-10-CM | POA: Diagnosis not present

## 2019-08-10 DIAGNOSIS — G459 Transient cerebral ischemic attack, unspecified: Secondary | ICD-10-CM | POA: Diagnosis not present

## 2019-08-10 DIAGNOSIS — Z720 Tobacco use: Secondary | ICD-10-CM | POA: Diagnosis not present

## 2019-08-10 DIAGNOSIS — E7849 Other hyperlipidemia: Secondary | ICD-10-CM

## 2019-08-10 MED ORDER — BUPROPION HCL ER (SR) 150 MG PO TB12: 150.0000 mg | ORAL_TABLET | Freq: Two times a day (BID) | ORAL | 1 refills | Status: DC
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
## 2019-08-10 NOTE — Patient Instructions (Addendum)
Pt to keep appointment with neurology on 9/10 for follow up Rest at home until neuro appt Wellbutrin for smoking cessation  Smoking Tobacco Information, Adult Smoking tobacco can be harmful to your health. Tobacco contains a poisonous (toxic), colorless chemical called nicotine. Nicotine is addictive. It changes the brain and can make it hard to stop smoking. Tobacco also has other toxic chemicals that can hurt your body and raise your risk of many cancers. How can smoking tobacco affect me? Smoking tobacco puts you at risk for:  Cancer. Smoking is most commonly associated with lung cancer, but can also lead to cancer in other parts of the body.  Chronic obstructive pulmonary disease (COPD). This is a long-term lung condition that makes it hard to breathe. It also gets worse over time.  High blood pressure (hypertension), heart disease, stroke, or heart attack.  Lung infections, such as pneumonia.  Cataracts. This is when the lenses in the eyes become clouded.  Digestive problems. This may include peptic ulcers, heartburn, and gastroesophageal reflux disease (GERD).  Oral health problems, such as gum disease and tooth loss.  Loss of taste and smell. Smoking can affect your appearance by causing:  Wrinkles.  Yellow or stained teeth, fingers, and fingernails. Smoking tobacco can also affect your social life, because:  It may be challenging to find places to smoke when away from home. Many workplaces, [employer], hotels, and public places are tobacco-free.  Smoking is expensive. This is due to the cost of tobacco and the long-term costs of treating health problems from smoking.  Secondhand smoke may affect those around you. Secondhand smoke can cause lung cancer, breathing problems, and heart disease. Children of smokers have a higher risk for: ? Sudden infant death syndrome (SIDS). ? Ear infections. ? Lung infections. If you currently smoke tobacco, quitting now can help  you:  Lead a longer and healthier life.  Look, smell, breathe, and feel better over time.  Save money.  Protect others from the harms of secondhand smoke. What actions can I take to prevent health problems? Quit smoking   Do not start smoking. Quit if you already do.  Make a plan to quit smoking and commit to it. Look for programs to help you and ask your health care provider for recommendations and ideas.  Set a date and write down all the reasons you want to quit.  Let your friends and family know you are quitting so they can help and support you. Consider finding friends who also want to quit. It can be easier to quit with someone else, so that you can support each other.  Talk with your health care provider about using nicotine replacement medicines to help you quit, such as gum, lozenges, patches, sprays, or pills.  Do not replace cigarette smoking with electronic cigarettes, which are commonly called e-cigarettes. The safety of e-cigarettes is not known, and some may contain harmful chemicals.  If you try to quit but return to smoking, stay positive. It is common to slip up when you first quit, so take it one day at a time.  Be prepared for cravings. When you feel the urge to smoke, chew gum or suck on hard candy. Lifestyle  Stay busy and take care of your body.  Drink enough fluid to keep your urine pale yellow.  Get plenty of exercise and eat a healthy diet. This can help prevent weight gain after quitting.  Monitor your eating habits. Quitting smoking can cause you to have a larger  appetite than when you smoke.  Find ways to relax. Go out with friends or family to a movie or a restaurant where people do not smoke.  Ask your health care provider about having regular tests (screenings) to check for cancer. This may include blood tests, imaging tests, and other tests.  Find ways to manage your stress, such as meditation, yoga, or exercise. Where to find support To  get support to quit smoking, consider:  Asking your health care provider for more information and resources.  Taking classes to learn more about quitting smoking.  Looking for local organizations that offer resources about quitting smoking.  Joining a support group for people who want to quit smoking in your local community.  Calling the smokefree.gov counselor helpline: 1-800-Quit-Now 501-167-9785) Where to find more information You may find more information about quitting smoking from:  HelpGuide.org: www.helpguide.org  https://hall.com/: smokefree.gov  American Lung Association: www.lung.org Contact a health care provider if you:  Have problems breathing.  Notice that your lips, nose, or fingers turn blue.  Have chest pain.  Are coughing up blood.  Feel faint or you pass out.  Have other health changes that cause you to worry. Summary  Smoking tobacco can negatively affect your health, the health of those around you, your finances, and your social life.  Do not start smoking. Quit if you already do. If you need help quitting, ask your health care provider.  Think about joining a support group for people who want to quit smoking in your local community. There are many effective programs that will help you to quit this behavior. This information is not intended to replace advice given to you by your health care provider. Make sure you discuss any questions you have with your health care provider. Document Released: 12/16/2016 Document Revised: 01/20/2018 Document Reviewed: 12/16/2016 Elsevier Patient Education  2020 [employer].

## 2019-08-11 ENCOUNTER — Ambulatory Visit (INDEPENDENT_AMBULATORY_CARE_PROVIDER_SITE_OTHER): Payer: Medicaid Other | Admitting: Obstetrics & Gynecology

## 2019-08-11 ENCOUNTER — Other Ambulatory Visit: Payer: Self-pay

## 2019-08-11 ENCOUNTER — Encounter: Payer: Self-pay | Admitting: Obstetrics & Gynecology

## 2019-08-11 VITALS — BP 106/73 | HR 83 | Ht 62.0 in | Wt 96.0 lb

## 2019-08-11 DIAGNOSIS — K5904 Chronic idiopathic constipation: Secondary | ICD-10-CM

## 2019-08-11 MED ORDER — POLYETHYLENE GLYCOL 3350 17 GM/SCOOP PO POWD: ORAL | 11 refills | Status: DC
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
## 2019-08-12 ENCOUNTER — Ambulatory Visit: Payer: Medicaid Other | Admitting: Advanced Practice Midwife

## 2019-08-13 DIAGNOSIS — K219 Gastro-esophageal reflux disease without esophagitis: Secondary | ICD-10-CM | POA: Insufficient documentation

## 2019-08-13 DIAGNOSIS — G459 Transient cerebral ischemic attack, unspecified: Secondary | ICD-10-CM | POA: Insufficient documentation

## 2019-08-13 DIAGNOSIS — Z720 Tobacco use: Secondary | ICD-10-CM | POA: Insufficient documentation

## 2019-08-13 DIAGNOSIS — E7849 Other hyperlipidemia: Secondary | ICD-10-CM | POA: Insufficient documentation

## 2019-08-13 MED ORDER — BUPROPION HCL ER (SR) 150 MG PO TB12: 150.0000 mg | ORAL_TABLET | Freq: Two times a day (BID) | ORAL | 1 refills | Status: DC

## 2019-08-25 DIAGNOSIS — M542 Cervicalgia: Secondary | ICD-10-CM | POA: Diagnosis not present

## 2019-08-25 DIAGNOSIS — I639 Cerebral infarction, unspecified: Secondary | ICD-10-CM | POA: Diagnosis not present

## 2019-08-30 ENCOUNTER — Telehealth: Payer: Self-pay

## 2019-08-30 ENCOUNTER — Encounter: Payer: Self-pay | Admitting: Family Medicine

## 2019-08-30 NOTE — Telephone Encounter (Signed)
Neurologist will need to provide work capabilities for pts workplace Please obtain a copy of neuro note

## 2019-08-30 NOTE — Telephone Encounter (Signed)
Non-Urgent Medical Question  [place], [place] Rickard Rhymes, MD 6 hours ago (9:57 AM)     Got this letter from neurologist and now boss wants something stating what i can do since  i have been having bad headaches ,dizzy and weak! I don't have anything else to give him can you help me ? Thank you Tiffany Barry

## 2019-08-31 NOTE — Telephone Encounter (Signed)
Patient is aware of recommendation 

## 2019-09-07 ENCOUNTER — Encounter: Payer: Self-pay | Admitting: Family Medicine

## 2019-09-07 MED ORDER — DICLOFENAC SODIUM 75 MG PO TBEC: DELAYED_RELEASE_TABLET | ORAL | 1 refills | Status: DC

## 2019-09-07 MED ORDER — ATORVASTATIN CALCIUM 40 MG PO TABS: 40.0000 mg | ORAL_TABLET | Freq: Every day | ORAL | 1 refills | Status: DC

## 2019-09-09 ENCOUNTER — Other Ambulatory Visit: Payer: Self-pay | Admitting: Adult Health

## 2019-09-12 ENCOUNTER — Other Ambulatory Visit: Payer: Self-pay | Admitting: Family Medicine

## 2019-09-12 DIAGNOSIS — G459 Transient cerebral ischemic attack, unspecified: Secondary | ICD-10-CM

## 2019-09-12 DIAGNOSIS — Z720 Tobacco use: Secondary | ICD-10-CM

## 2019-09-12 DIAGNOSIS — E7849 Other hyperlipidemia: Secondary | ICD-10-CM

## 2019-09-12 DIAGNOSIS — K219 Gastro-esophageal reflux disease without esophagitis: Secondary | ICD-10-CM

## 2019-09-13 DIAGNOSIS — K219 Gastro-esophageal reflux disease without esophagitis: Secondary | ICD-10-CM | POA: Diagnosis not present

## 2019-09-13 DIAGNOSIS — E7849 Other hyperlipidemia: Secondary | ICD-10-CM | POA: Diagnosis not present

## 2019-09-13 DIAGNOSIS — D619 Aplastic anemia, unspecified: Secondary | ICD-10-CM | POA: Diagnosis not present

## 2019-09-14 ENCOUNTER — Telehealth: Payer: Self-pay

## 2019-09-14 DIAGNOSIS — D619 Aplastic anemia, unspecified: Secondary | ICD-10-CM

## 2019-09-14 NOTE — Telephone Encounter (Signed)
Tiffany Barry, CMA  

## 2019-09-15 LAB — CBC
HCT: 35.6 % (ref 35.0–45.0)
Hemoglobin: 11.8 g/dL (ref 11.7–15.5)
MCH: 33.6 pg — ABNORMAL HIGH (ref 27.0–33.0)
MCHC: 33.1 g/dL (ref 32.0–36.0)
MCV: 101.4 fL — ABNORMAL HIGH (ref 80.0–100.0)
MPV: 10.6 fL (ref 7.5–12.5)
Platelets: 244 10*3/uL (ref 140–400)
RBC: 3.51 10*6/uL — ABNORMAL LOW (ref 3.80–5.10)
RDW: 11.8 % (ref 11.0–15.0)
WBC: 7.9 10*3/uL (ref 3.8–10.8)

## 2019-09-15 LAB — COMPLETE METABOLIC PANEL WITH GFR
AG Ratio: 2.2 (calc) (ref 1.0–2.5)
ALT: 21 U/L (ref 6–29)
AST: 34 U/L (ref 10–35)
Albumin: 4.3 g/dL (ref 3.6–5.1)
Alkaline phosphatase (APISO): 85 U/L (ref 37–153)
BUN: 12 mg/dL (ref 7–25)
CO2: 26 mmol/L (ref 20–32)
Calcium: 9 mg/dL (ref 8.6–10.4)
Chloride: 108 mmol/L (ref 98–110)
Creat: 0.74 mg/dL (ref 0.50–1.05)
GFR, Est African American: 109 mL/min/{1.73_m2} (ref >=60)
GFR, Est Non African American: 94 mL/min/{1.73_m2} (ref >=60)
Globulin: 2 g/dL (calc) (ref 1.9–3.7)
Glucose, Bld: 70 mg/dL (ref 65–99)
Potassium: 4.4 mmol/L (ref 3.5–5.3)
Sodium: 145 mmol/L (ref 135–146)
Total Bilirubin: 0.3 mg/dL (ref 0.2–1.2)
Total Protein: 6.3 g/dL (ref 6.1–8.1)

## 2019-09-15 LAB — B12 AND FOLATE PANEL
Folate: 12.7 ng/mL
Vitamin B-12: 401 pg/mL (ref 200–1100)

## 2019-09-15 LAB — TEST AUTHORIZATION

## 2019-09-15 LAB — TSH: TSH: 0.79 mIU/L

## 2019-09-15 LAB — LIPID PANEL
Cholesterol: 165 mg/dL (ref <200)
HDL: 63 mg/dL (ref >=50)
LDL Cholesterol (Calc): 79 mg/dL (calc)
Non-HDL Cholesterol (Calc): 102 mg/dL (calc) (ref <130)
Total CHOL/HDL Ratio: 2.6 (calc) (ref <5.0)
Triglycerides: 130 mg/dL (ref <150)

## 2019-09-19 ENCOUNTER — Ambulatory Visit (INDEPENDENT_AMBULATORY_CARE_PROVIDER_SITE_OTHER): Payer: Medicaid Other | Admitting: Family Medicine

## 2019-09-19 ENCOUNTER — Encounter: Payer: Self-pay | Admitting: Family Medicine

## 2019-09-19 ENCOUNTER — Other Ambulatory Visit: Payer: Self-pay

## 2019-09-19 VITALS — BP 108/73 | HR 102 | Temp 98.9°F | Ht 62.0 in | Wt 101.8 lb

## 2019-09-19 DIAGNOSIS — Z720 Tobacco use: Secondary | ICD-10-CM

## 2019-09-19 DIAGNOSIS — M51369 Other intervertebral disc degeneration, lumbar region without mention of lumbar back pain or lower extremity pain: Secondary | ICD-10-CM

## 2019-09-19 DIAGNOSIS — M5136 Other intervertebral disc degeneration, lumbar region: Secondary | ICD-10-CM

## 2019-09-19 DIAGNOSIS — M533 Sacrococcygeal disorders, not elsewhere classified: Secondary | ICD-10-CM | POA: Diagnosis not present

## 2019-09-19 DIAGNOSIS — G459 Transient cerebral ischemic attack, unspecified: Secondary | ICD-10-CM

## 2019-09-19 DIAGNOSIS — D619 Aplastic anemia, unspecified: Secondary | ICD-10-CM

## 2019-09-19 DIAGNOSIS — K219 Gastro-esophageal reflux disease without esophagitis: Secondary | ICD-10-CM

## 2019-09-19 DIAGNOSIS — E7849 Other hyperlipidemia: Secondary | ICD-10-CM
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
## 2019-09-19 NOTE — Patient Instructions (Signed)
Pain management referral

## 2019-09-21 ENCOUNTER — Telehealth: Payer: Self-pay | Admitting: *Deleted

## 2019-09-26 ENCOUNTER — Telehealth: Payer: Self-pay | Admitting: Family Medicine

## 2019-09-26 NOTE — Telephone Encounter (Signed)
Francesco Runner is calling from [facility] and states the patient was seen with them on 08/25/19 from a hospital follow up and the patient is needing to be referred to Orthopedic but due to her insurance being Medicaid [place] access she is needing approval from patient pcp. Phone # [phone] fax# [phone].

## 2019-09-26 NOTE — Telephone Encounter (Signed)
Routing to Dr. Corum 

## 2019-09-26 NOTE — Telephone Encounter (Signed)
I reviewed neuro note-neck pain?? Referral to spine surgery due to headaches?? I need a diagnosis to refer

## 2019-09-27 NOTE — Telephone Encounter (Signed)
[facility] in [place] for her neck & back pain

## 2019-09-28 ENCOUNTER — Other Ambulatory Visit: Payer: Self-pay | Admitting: Family Medicine

## 2019-09-28 DIAGNOSIS — M5136 Other intervertebral disc degeneration, lumbar region: Secondary | ICD-10-CM

## 2019-09-30 ENCOUNTER — Telehealth: Payer: Self-pay

## 2019-09-30 NOTE — Telephone Encounter (Signed)
FYI- Called pt to find out where in [place] she wanted me to send referral for her.  She said she has already contacted them and they said they declined her as a patient. She said she went to Dr Carman Ching 09/28/19 at [facility] and he is ordering a MRI.  When he gets the MRI back he will refer her to a Psychologist, sport and exercise.  She said we do not need to referr her anywhere at this point.  She has been turned down by all the doctors in [place] and [place] so she is going with Dr Carman Ching and the referral he places after the MRI.   She will let us know the outcome of these visits.   She lives in this area but works in [place].

## 2019-10-03 NOTE — Telephone Encounter (Signed)
Noted  

## 2019-11-07 DIAGNOSIS — M5020 Other cervical disc displacement, unspecified cervical region: Secondary | ICD-10-CM | POA: Diagnosis not present

## 2019-11-07 DIAGNOSIS — M47812 Spondylosis without myelopathy or radiculopathy, cervical region: Secondary | ICD-10-CM | POA: Diagnosis not present

## 2019-11-15 ENCOUNTER — Ambulatory Visit: Payer: Medicaid Other | Admitting: Student in an Organized Health Care Education/Training Program

## 2019-12-29 DIAGNOSIS — G43719 Chronic migraine without aura, intractable, without status migrainosus: Secondary | ICD-10-CM | POA: Diagnosis not present

## 2020-01-04 ENCOUNTER — Telehealth: Payer: Self-pay | Admitting: Family Medicine

## 2020-01-04 NOTE — Telephone Encounter (Signed)
Patient is calling to see if her paperwork she dropped off has been completely and ready for pick up.

## 2020-01-05 NOTE — Telephone Encounter (Signed)
Informed patient the paper work could not be completed per Dr. Judee Clara.

## 2020-01-06 ENCOUNTER — Ambulatory Visit (INDEPENDENT_AMBULATORY_CARE_PROVIDER_SITE_OTHER): Payer: Medicaid Other | Admitting: Family Medicine

## 2020-01-06 ENCOUNTER — Encounter: Payer: Self-pay | Admitting: Family Medicine

## 2020-01-06 ENCOUNTER — Other Ambulatory Visit: Payer: Self-pay

## 2020-01-06 VITALS — BP 123/73 | HR 88 | Ht 60.5 in | Wt 101.4 lb

## 2020-01-06 DIAGNOSIS — G43709 Chronic migraine without aura, not intractable, without status migrainosus: Secondary | ICD-10-CM

## 2020-01-06 DIAGNOSIS — Z8673 Personal history of transient ischemic attack (TIA), and cerebral infarction without residual deficits: Secondary | ICD-10-CM | POA: Insufficient documentation

## 2020-01-06 DIAGNOSIS — G8929 Other chronic pain: Secondary | ICD-10-CM

## 2020-01-06 DIAGNOSIS — Z720 Tobacco use: Secondary | ICD-10-CM | POA: Diagnosis not present

## 2020-01-06 DIAGNOSIS — M545 Low back pain: Secondary | ICD-10-CM

## 2020-01-06 DIAGNOSIS — IMO0002 Reserved for concepts with insufficient information to code with codable children: Secondary | ICD-10-CM | POA: Insufficient documentation

## 2020-01-06 MED ORDER — BUTALBITAL-APAP-CAFFEINE 50-325-40 MG PO TABS: 1.0000 | ORAL_TABLET | Freq: Four times a day (QID) | ORAL | 0 refills | Status: DC | PRN

## 2020-01-06 MED ORDER — POLYETHYLENE GLYCOL 3350 17 GM/SCOOP PO POWD: ORAL | 11 refills | Status: DC

## 2020-01-06 NOTE — Patient Instructions (Signed)
    Quercetin:  Take twice daily
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
## 2020-02-17 ENCOUNTER — Encounter: Payer: Self-pay | Admitting: Family Medicine

## 2020-02-17 ENCOUNTER — Ambulatory Visit (INDEPENDENT_AMBULATORY_CARE_PROVIDER_SITE_OTHER): Payer: Medicaid Other | Admitting: Family Medicine

## 2020-02-17 ENCOUNTER — Other Ambulatory Visit: Payer: Self-pay

## 2020-02-17 VITALS — BP 120/73 | HR 77

## 2020-02-17 DIAGNOSIS — M79672 Pain in left foot: Secondary | ICD-10-CM | POA: Diagnosis not present

## 2020-02-17 DIAGNOSIS — J029 Acute pharyngitis, unspecified: Secondary | ICD-10-CM | POA: Diagnosis not present

## 2020-02-17 MED ORDER — AMOXICILLIN-POT CLAVULANATE 875-125 MG PO TABS: 1.0000 | ORAL_TABLET | Freq: Two times a day (BID) | ORAL | 0 refills | Status: DC
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
## 2020-02-17 NOTE — Patient Instructions (Signed)
Plantar fibroma

## 2020-03-26 ENCOUNTER — Telehealth: Payer: Self-pay | Admitting: Family Medicine

## 2020-03-26 DIAGNOSIS — G479 Sleep disorder, unspecified: Secondary | ICD-10-CM
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
## 2020-03-27 NOTE — Telephone Encounter (Signed)
See below. Do you need to see patient in office?

## 2020-03-28 NOTE — Telephone Encounter (Signed)
Referral made for sleep issues.  She should probably talk to her neurologist about the memory/head troubles.

## 2020-03-28 NOTE — Telephone Encounter (Signed)
I left voicemail for patient advising. 

## 2020-05-05 ENCOUNTER — Other Ambulatory Visit: Payer: Self-pay

## 2020-05-05 ENCOUNTER — Telehealth: Payer: Self-pay | Admitting: Emergency Medicine

## 2020-05-05 ENCOUNTER — Encounter: Payer: Self-pay | Admitting: Emergency Medicine

## 2020-05-05 ENCOUNTER — Ambulatory Visit
Admission: EM | Admit: 2020-05-05 | Discharge: 2020-05-05 | Disposition: A | Discharge status: Home / Self Care | Payer: Medicaid Other | Attending: Emergency Medicine | Admitting: Emergency Medicine

## 2020-05-05 DIAGNOSIS — J069 Acute upper respiratory infection, unspecified: Secondary | ICD-10-CM

## 2020-05-05 DIAGNOSIS — R6889 Other general symptoms and signs: Secondary | ICD-10-CM | POA: Diagnosis not present

## 2020-05-05 DIAGNOSIS — Z1152 Encounter for screening for COVID-19: Secondary | ICD-10-CM | POA: Diagnosis not present

## 2020-05-05 DIAGNOSIS — R112 Nausea with vomiting, unspecified: Secondary | ICD-10-CM | POA: Diagnosis not present

## 2020-05-05 DIAGNOSIS — R509 Fever, unspecified: Secondary | ICD-10-CM

## 2020-05-05 LAB — POCT INFLUENZA A/B
Influenza A, POC: NEGATIVE
Influenza B, POC: NEGATIVE

## 2020-05-05 LAB — POC SARS CORONAVIRUS 2 AG -  ED: SARS Coronavirus 2 Ag: NEGATIVE

## 2020-05-05 MED ORDER — CETIRIZINE HCL 10 MG PO TABS: 10.0000 mg | ORAL_TABLET | Freq: Every day | ORAL | 0 refills | Status: DC

## 2020-05-05 MED ORDER — ONDANSETRON HCL 4 MG PO TABS: 4.0000 mg | ORAL_TABLET | Freq: Three times a day (TID) | ORAL | 0 refills | Status: DC | PRN

## 2020-05-05 MED ORDER — PREDNISONE 10 MG PO TABS
20.0000 mg | ORAL_TABLET | Freq: Every day | ORAL | 0 refills | Status: DC
Start: 2020-05-05 — End: 2020-07-03

## 2020-05-05 MED ORDER — BENZONATATE 100 MG PO CAPS
100.0000 mg | ORAL_CAPSULE | Freq: Three times a day (TID) | ORAL | 0 refills | Status: DC
Start: 2020-05-05 — End: 2020-07-03

## 2020-05-05 MED ORDER — ACETAMINOPHEN 325 MG PO TABS
650.0000 mg | ORAL_TABLET | Freq: Once | ORAL | Status: AC
  Administered 2020-05-05: 650 mg via ORAL

## 2020-05-05 NOTE — Discharge Instructions (Signed)
COVID testing ordered.  It will take between 2-7 days for test results.  Someone will contact you regarding abnormal results.    In the meantime: You should remain isolated in your home for 10 days from symptom onset AND greater than 24 hours after symptoms resolution (absence of fever without the use of fever-reducing medication and improvement in respiratory symptoms), whichever is longer Get plenty of rest and push fluids Tessalon Perles prescribed for cough Zyrtec-D prescribed for nasal congestion, runny nose, and/or sore throat Zofran prescribed for nausea  Use medications daily for symptom relief Use OTC medications like ibuprofen or tylenol as needed fever or pain Call or go to the ED if you have any new or worsening symptoms such as fever, worsening cough, shortness of breath, chest tightness, chest pain, turning blue, changes in mental status, etc..Marland Kitchen
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
## 2020-05-05 NOTE — ED Triage Notes (Signed)
Pt here for fever and body aches x 2 days; pt sts some vomiting last night; pt with cough

## 2020-05-06 LAB — SPECIMEN STATUS REPORT

## 2020-05-06 LAB — NOVEL CORONAVIRUS, NAA: SARS-CoV-2, NAA: NOT DETECTED

## 2020-05-06 LAB — SARS-COV-2, NAA 2 DAY TAT

## 2020-05-07 ENCOUNTER — Encounter: Payer: Self-pay | Admitting: Family Medicine

## 2020-05-07 ENCOUNTER — Telehealth: Payer: Self-pay | Admitting: Family Medicine

## 2020-05-07 MED ORDER — ONDANSETRON 8 MG PO TBDP
8.0000 mg | ORAL_TABLET | Freq: Three times a day (TID) | ORAL | 0 refills | Status: DC | PRN
Start: 2020-05-07 — End: 2020-07-03

## 2020-05-07 MED ORDER — PROMETHAZINE HCL 25 MG PO TABS
25.0000 mg | ORAL_TABLET | Freq: Four times a day (QID) | ORAL | 0 refills | Status: DC | PRN
Start: 2020-05-07 — End: 2020-12-26

## 2020-05-07 NOTE — Addendum Note (Signed)
Addended by: Lillia Carmel on: 05/07/2020 12:05 PM   Modules accepted: Orders

## 2020-05-07 NOTE — Telephone Encounter (Signed)
Patient called requesting an appointment. Patient waiting for Covid test results. Patient has covid symptoms with fever and would like Dr. Prince Rome to give her a call. Patient phone number is [phone].

## 2020-05-07 NOTE — Telephone Encounter (Signed)
Dr. Prince Rome communicated with the patient through MyChart today about this.

## 2020-05-07 NOTE — Telephone Encounter (Signed)
Patient would like a call back in regards to her Covid test.  She said that she just sent it via myChart.  CB#[phone].  Thank you.

## 2020-05-07 NOTE — Telephone Encounter (Signed)
Dr. Prince Rome communicated with the patient about this through MyChart today.

## 2020-05-07 NOTE — Addendum Note (Signed)
Addended by: Lillia Carmel on: 05/07/2020 12:52 PM   Modules accepted: Orders

## 2020-05-08 ENCOUNTER — Ambulatory Visit: Payer: Medicaid Other | Admitting: Family Medicine

## 2020-05-09 ENCOUNTER — Encounter: Payer: Self-pay | Admitting: Family Medicine

## 2020-05-10 ENCOUNTER — Encounter: Payer: Self-pay | Admitting: Family Medicine

## 2020-05-11 ENCOUNTER — Encounter: Payer: Self-pay | Admitting: Family Medicine

## 2020-05-11 DIAGNOSIS — M79604 Pain in right leg: Secondary | ICD-10-CM | POA: Diagnosis not present

## 2020-05-11 DIAGNOSIS — R52 Pain, unspecified: Secondary | ICD-10-CM | POA: Diagnosis not present

## 2020-05-11 DIAGNOSIS — M25551 Pain in right hip: Secondary | ICD-10-CM | POA: Diagnosis not present

## 2020-05-11 MED ORDER — HYDROCODONE-ACETAMINOPHEN 5-325 MG PO TABS: 1.0000 | ORAL_TABLET | Freq: Two times a day (BID) | ORAL | 0 refills | Status: DC | PRN

## 2020-05-15 ENCOUNTER — Encounter: Payer: Self-pay | Admitting: Family Medicine

## 2020-05-15 DIAGNOSIS — M545 Low back pain, unspecified: Secondary | ICD-10-CM

## 2020-05-15 DIAGNOSIS — G8929 Other chronic pain: Secondary | ICD-10-CM

## 2020-05-16 ENCOUNTER — Encounter: Payer: Self-pay | Admitting: Family Medicine

## 2020-05-17 ENCOUNTER — Encounter: Payer: Self-pay | Admitting: Family Medicine

## 2020-05-22 ENCOUNTER — Telehealth: Payer: Self-pay | Admitting: Family Medicine

## 2020-05-22 ENCOUNTER — Encounter: Payer: Self-pay | Admitting: Family Medicine

## 2020-05-22 NOTE — Telephone Encounter (Signed)
Please advise. From the notes she is suppose to followup with a neurosurgeon.

## 2020-05-22 NOTE — Telephone Encounter (Signed)
She needs to see the spine surgeon.  We can give her a work note if needed.

## 2020-05-22 NOTE — Telephone Encounter (Signed)
Forwarding to you since you spoke with the pt

## 2020-05-22 NOTE — Telephone Encounter (Signed)
Patient states that she doesn't want to see Dr. Trudee Grip office, she would like to see someone else for her loosening hardware.  Can you please ask Dr. Ophelia Charter if he would see her?

## 2020-05-22 NOTE — Telephone Encounter (Signed)
Patient asked for a call back. Patient states Dr. Prince Rome nurse stated she can get an appointment today. Dr. Prince Rome doesn't have any opening for today at this time. Patient's phone number is [phone]

## 2020-05-23 ENCOUNTER — Encounter: Payer: Self-pay | Admitting: Family Medicine

## 2020-05-23 ENCOUNTER — Other Ambulatory Visit: Payer: Self-pay | Admitting: Radiology

## 2020-05-23 DIAGNOSIS — M545 Low back pain, unspecified: Secondary | ICD-10-CM

## 2020-05-23 NOTE — Telephone Encounter (Signed)
No thank you. Surgery 6 yrs ago. She can see any of the other partners since Gerlene Fee is gone,

## 2020-05-23 NOTE — Telephone Encounter (Signed)
Could you please see below and advise. Patient had previous surgery with Dr. Gerlene Fee but does not wish to go back to that practice. Per this message, she has loosening hardware and Dr. Prince Rome wants her to see a spine surgeon.

## 2020-05-23 NOTE — Telephone Encounter (Signed)
Referral entered for Dr. Marshell Levan office

## 2020-05-23 NOTE — Telephone Encounter (Signed)
Pending appt with Dr. Gerlene Fee

## 2020-05-23 NOTE — Telephone Encounter (Signed)
Please see message from Dr. Ophelia Charter below. Thanks.

## 2020-05-28 MED ORDER — HYDROCODONE-ACETAMINOPHEN 5-325 MG PO TABS: 1.0000 | ORAL_TABLET | Freq: Two times a day (BID) | ORAL | 0 refills | Status: DC | PRN

## 2020-06-04 DIAGNOSIS — M5416 Radiculopathy, lumbar region: Secondary | ICD-10-CM | POA: Diagnosis not present

## 2020-06-14 DIAGNOSIS — M545 Low back pain: Secondary | ICD-10-CM | POA: Diagnosis not present

## 2020-07-02 ENCOUNTER — Telehealth: Payer: Self-pay | Admitting: Family Medicine

## 2020-07-02 NOTE — Telephone Encounter (Signed)
Patient called and states she needs to been seen right away. Patient stated her stomach is swollen and asking for Dr. Prince Rome nurse to give her a call. Patient phone number is [phone].

## 2020-07-02 NOTE — Telephone Encounter (Signed)
I called: coming in tomorrow at 9:40 to be evaluated. She did not want to go to an urgent care or the ED tonight.

## 2020-07-03 ENCOUNTER — Encounter: Payer: Self-pay | Admitting: Family Medicine

## 2020-07-03 ENCOUNTER — Ambulatory Visit (INDEPENDENT_AMBULATORY_CARE_PROVIDER_SITE_OTHER): Payer: Medicaid Other | Admitting: Family Medicine

## 2020-07-03 ENCOUNTER — Other Ambulatory Visit: Payer: Self-pay

## 2020-07-03 VITALS — BP 119/76 | HR 93 | Ht 60.5 in | Wt 109.8 lb

## 2020-07-03 DIAGNOSIS — R140 Abdominal distension (gaseous): Secondary | ICD-10-CM

## 2020-07-03 DIAGNOSIS — R300 Dysuria: Secondary | ICD-10-CM | POA: Diagnosis not present

## 2020-07-03 MED ORDER — SULFAMETHOXAZOLE-TRIMETHOPRIM 800-160 MG PO TABS
1.0000 | ORAL_TABLET | Freq: Two times a day (BID) | ORAL | 0 refills | Status: DC
Start: 2020-07-03 — End: 2020-07-03

## 2020-07-03 MED ORDER — SULFAMETHOXAZOLE-TRIMETHOPRIM 800-160 MG PO TABS
1.0000 | ORAL_TABLET | Freq: Two times a day (BID) | ORAL | 0 refills | Status: DC
Start: 2020-07-03 — End: 2020-12-26
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
## 2020-07-06 LAB — URINALYSIS W MICROSCOPIC + REFLEX CULTURE
Bilirubin Urine: NEGATIVE
Glucose, UA: NEGATIVE
Hgb urine dipstick: NEGATIVE
Hyaline Cast: NONE SEEN /LPF
Ketones, ur: NEGATIVE
Nitrites, Initial: POSITIVE — AB
Specific Gravity, Urine: 1.014 (ref 1.001–1.03)
pH: >=8.5 — AB (ref 5.0–8.0)

## 2020-07-06 LAB — URINE CULTURE
MICRO NUMBER:: 10731462
SPECIMEN QUALITY:: ADEQUATE

## 2020-07-06 LAB — CULTURE INDICATED

## 2020-07-09 DIAGNOSIS — M545 Low back pain: Secondary | ICD-10-CM | POA: Diagnosis not present

## 2020-07-09 DIAGNOSIS — M5416 Radiculopathy, lumbar region: Secondary | ICD-10-CM | POA: Diagnosis not present

## 2020-07-16 ENCOUNTER — Ambulatory Visit (HOSPITAL_COMMUNITY)
Admission: RE | Admit: 2020-07-16 | Discharge: 2020-07-16 | Disposition: A | Discharge status: Home / Self Care | Payer: Medicaid Other | Source: Ambulatory Visit | Attending: Family Medicine | Admitting: Family Medicine

## 2020-07-16 ENCOUNTER — Other Ambulatory Visit: Payer: Self-pay

## 2020-07-16 ENCOUNTER — Telehealth: Payer: Self-pay | Admitting: Family Medicine

## 2020-07-16 DIAGNOSIS — Q600 Renal agenesis, unilateral: Secondary | ICD-10-CM | POA: Diagnosis not present

## 2020-07-16 DIAGNOSIS — R140 Abdominal distension (gaseous): Secondary | ICD-10-CM | POA: Diagnosis not present

## 2020-07-16 DIAGNOSIS — R300 Dysuria: Secondary | ICD-10-CM | POA: Diagnosis not present

## 2020-07-16 DIAGNOSIS — K7689 Other specified diseases of liver: Secondary | ICD-10-CM | POA: Diagnosis not present

## 2020-07-16 DIAGNOSIS — M545 Low back pain: Secondary | ICD-10-CM | POA: Diagnosis not present

## 2020-07-16 DIAGNOSIS — M5416 Radiculopathy, lumbar region: Secondary | ICD-10-CM | POA: Diagnosis not present

## 2020-07-16 NOTE — Telephone Encounter (Signed)
Ultrasound of abdomen shows mild fatty liver disease.  Otherwise normal.

## 2020-07-31 ENCOUNTER — Other Ambulatory Visit: Payer: Self-pay | Admitting: *Deleted

## 2020-07-31 ENCOUNTER — Telehealth: Payer: Self-pay | Admitting: Family Medicine

## 2020-07-31 MED ORDER — NITROFURANTOIN MONOHYD MACRO 100 MG PO CAPS
100.0000 mg | ORAL_CAPSULE | Freq: Two times a day (BID) | ORAL | 0 refills | Status: DC
Start: 2020-07-31 — End: 2020-12-26

## 2020-07-31 NOTE — Telephone Encounter (Signed)
Urine grew E. Coli.

## 2020-07-31 NOTE — Addendum Note (Signed)
Addended by: Lillia Carmel on: 07/31/2020 12:47 PM   Modules accepted: Orders

## 2020-08-01 MED ORDER — PROAIR HFA 108 (90 BASE) MCG/ACT IN AERS: INHALATION_SPRAY | RESPIRATORY_TRACT | 2 refills | Status: DC

## 2020-08-14 DIAGNOSIS — R200 Anesthesia of skin: Secondary | ICD-10-CM | POA: Diagnosis not present

## 2020-08-14 DIAGNOSIS — R202 Paresthesia of skin: Secondary | ICD-10-CM | POA: Diagnosis not present

## 2020-08-14 DIAGNOSIS — R42 Dizziness and giddiness: Secondary | ICD-10-CM | POA: Diagnosis not present

## 2020-08-21 DIAGNOSIS — M5416 Radiculopathy, lumbar region: Secondary | ICD-10-CM | POA: Diagnosis not present

## 2020-09-01 DIAGNOSIS — M545 Low back pain: Secondary | ICD-10-CM | POA: Diagnosis not present

## 2020-09-08 ENCOUNTER — Observation Stay (HOSPITAL_COMMUNITY)
Admission: EM | Admit: 2020-09-08 | Discharge: 2020-09-08 | Disposition: A | Discharge status: Home / Self Care | Payer: Medicaid Other | Attending: Family Medicine | Admitting: Family Medicine

## 2020-09-08 ENCOUNTER — Encounter (HOSPITAL_COMMUNITY): Payer: Self-pay | Admitting: Emergency Medicine

## 2020-09-08 ENCOUNTER — Emergency Department (HOSPITAL_COMMUNITY): Payer: Medicaid Other

## 2020-09-08 ENCOUNTER — Other Ambulatory Visit: Payer: Self-pay

## 2020-09-08 DIAGNOSIS — F17210 Nicotine dependence, cigarettes, uncomplicated: Secondary | ICD-10-CM | POA: Diagnosis not present

## 2020-09-08 DIAGNOSIS — Z8673 Personal history of transient ischemic attack (TIA), and cerebral infarction without residual deficits: Secondary | ICD-10-CM

## 2020-09-08 DIAGNOSIS — Z7982 Long term (current) use of aspirin: Secondary | ICD-10-CM | POA: Insufficient documentation

## 2020-09-08 DIAGNOSIS — R42 Dizziness and giddiness: Secondary | ICD-10-CM

## 2020-09-08 DIAGNOSIS — J45909 Unspecified asthma, uncomplicated: Secondary | ICD-10-CM | POA: Insufficient documentation

## 2020-09-08 DIAGNOSIS — G459 Transient cerebral ischemic attack, unspecified: Principal | ICD-10-CM | POA: Diagnosis present

## 2020-09-08 DIAGNOSIS — IMO0002 Reserved for concepts with insufficient information to code with codable children: Secondary | ICD-10-CM | POA: Diagnosis present

## 2020-09-08 DIAGNOSIS — H532 Diplopia: Secondary | ICD-10-CM | POA: Diagnosis not present

## 2020-09-08 DIAGNOSIS — Z79899 Other long term (current) drug therapy: Secondary | ICD-10-CM | POA: Diagnosis not present

## 2020-09-08 DIAGNOSIS — E7849 Other hyperlipidemia: Secondary | ICD-10-CM | POA: Diagnosis present

## 2020-09-08 DIAGNOSIS — Z720 Tobacco use: Secondary | ICD-10-CM | POA: Diagnosis present

## 2020-09-08 LAB — COMPREHENSIVE METABOLIC PANEL
ALT: 28 U/L (ref 0–44)
AST: 40 U/L (ref 15–41)
Albumin: 4.6 g/dL (ref 3.5–5.0)
Alkaline Phosphatase: 125 U/L (ref 38–126)
Anion gap: 15 (ref 5–15)
BUN: 11 mg/dL (ref 6–20)
CO2: 26 mmol/L (ref 22–32)
Calcium: 9.3 mg/dL (ref 8.9–10.3)
Chloride: 96 mmol/L — ABNORMAL LOW (ref 98–111)
Creatinine, Ser: 0.69 mg/dL (ref 0.44–1.00)
GFR calc Af Amer: >60 mL/min (ref >60)
GFR calc non Af Amer: >60 mL/min (ref >60)
Glucose, Bld: 91 mg/dL (ref 70–99)
Potassium: 3.8 mmol/L (ref 3.5–5.1)
Sodium: 137 mmol/L (ref 135–145)
Total Bilirubin: 0.6 mg/dL (ref 0.3–1.2)
Total Protein: 8.1 g/dL (ref 6.5–8.1)

## 2020-09-08 LAB — RAPID URINE DRUG SCREEN, HOSP PERFORMED
Amphetamines: NOT DETECTED
Barbiturates: NOT DETECTED
Benzodiazepines: NOT DETECTED
Cocaine: NOT DETECTED
Opiates: NOT DETECTED
Tetrahydrocannabinol: NOT DETECTED

## 2020-09-08 LAB — DIFFERENTIAL
Abs Immature Granulocytes: 0.02 10*3/uL (ref 0.00–0.07)
Basophils Absolute: 0.1 10*3/uL (ref 0.0–0.1)
Basophils Relative: 1 %
Eosinophils Absolute: 0.1 10*3/uL (ref 0.0–0.5)
Eosinophils Relative: 2 %
Immature Granulocytes: 0 %
Lymphocytes Relative: 37 %
Lymphs Abs: 2.6 10*3/uL (ref 0.7–4.0)
Monocytes Absolute: 0.3 10*3/uL (ref 0.1–1.0)
Monocytes Relative: 5 %
Neutro Abs: 4 10*3/uL (ref 1.7–7.7)
Neutrophils Relative %: 55 %

## 2020-09-08 LAB — URINALYSIS, ROUTINE W REFLEX MICROSCOPIC
Bacteria, UA: NONE SEEN
Bilirubin Urine: NEGATIVE
Glucose, UA: NEGATIVE mg/dL
Ketones, ur: 5 mg/dL — AB
Leukocytes,Ua: NEGATIVE
Nitrite: NEGATIVE
Protein, ur: NEGATIVE mg/dL
Specific Gravity, Urine: 1.034 — ABNORMAL HIGH (ref 1.005–1.030)
pH: 7 (ref 5.0–8.0)

## 2020-09-08 LAB — I-STAT CHEM 8, ED
BUN: 12 mg/dL (ref 6–20)
Calcium, Ion: 1.11 mmol/L — ABNORMAL LOW (ref 1.15–1.40)
Chloride: 99 mmol/L (ref 98–111)
Creatinine, Ser: 0.6 mg/dL (ref 0.44–1.00)
Glucose, Bld: 83 mg/dL (ref 70–99)
HCT: 47 % — ABNORMAL HIGH (ref 36.0–46.0)
Hemoglobin: 16 g/dL — ABNORMAL HIGH (ref 12.0–15.0)
Potassium: 4.2 mmol/L (ref 3.5–5.1)
Sodium: 137 mmol/L (ref 135–145)
TCO2: 27 mmol/L (ref 22–32)

## 2020-09-08 LAB — CBC
HCT: 43.3 % (ref 36.0–46.0)
Hemoglobin: 14.3 g/dL (ref 12.0–15.0)
MCH: 32.7 pg (ref 26.0–34.0)
MCHC: 33 g/dL (ref 30.0–36.0)
MCV: 99.1 fL (ref 80.0–100.0)
Platelets: 254 10*3/uL (ref 150–400)
RBC: 4.37 MIL/uL (ref 3.87–5.11)
RDW: 12.2 % (ref 11.5–15.5)
WBC: 7 10*3/uL (ref 4.0–10.5)
nRBC: 0 % (ref 0.0–0.2)

## 2020-09-08 LAB — PROTIME-INR
INR: 0.9 (ref 0.8–1.2)
Prothrombin Time: 12.2 seconds (ref 11.4–15.2)

## 2020-09-08 LAB — ETHANOL: Alcohol, Ethyl (B): <10 mg/dL (ref <10)

## 2020-09-08 LAB — POC URINE PREG, ED: Preg Test, Ur: NEGATIVE

## 2020-09-08 LAB — APTT: aPTT: 29 seconds (ref 24–36)

## 2020-09-08 MED ORDER — IOHEXOL 350 MG/ML SOLN
100.0000 mL | Freq: Once | INTRAVENOUS | Status: AC | PRN
  Administered 2020-09-08: 100 mL via INTRAVENOUS

## 2020-09-08 MED ORDER — CLOPIDOGREL BISULFATE 75 MG PO TABS
75.0000 mg | ORAL_TABLET | Freq: Once | ORAL | Status: AC
  Administered 2020-09-08: 75 mg via ORAL
  Filled 2020-09-08: qty 1

## 2020-09-08 NOTE — Consult Note (Addendum)
Addendum:   I followed up the final CTA head and neck reports.  No LVO or acute vascular pathology.  Patient is leaving from the ER AMA.  As such I recommend that she stay on her Aspirin and Statin.  She should also follow up asap with her PCP and out-patient Neurologist.  She should also return to the ER immediately with any new or worsening symptoms.  PA Sponseller reported understanding.          CTA Head 09/08/2020 ADDENDUM Due to technical error, CTA head findings were not included in the combined CTA head and CTA neck report. Intracranially there is no acute abnormality. No significant (greater than 50%) arterial stenosis, occlusion, or aneurysm. Visualized portions of the dural sinuses are patent.  Please see recent CT head for extravascular evaluation.  Tiffany Ang, MD   [facility] Tele[facility] Consult Services  Stat Consult  Date of Service:   09/08/2020 11:16:07  Impression:     .  I63.9 - Cerebrovascular accident (CVA), unspecified mechanism (HCC)  Comments/Sign-Out: Tiffany Barry is a 49 y.o. lady with a pmh of tobacco abuse, previous aborted stroke in August of 2020 (received Alteplase), HLD, chronic low back pain, chronic migraine, and other medical issues who presents to the ER with symptoms concerning for stroke. Exam at this time appears non-focal except she is a little tremulous in her b/l LE's with activation. She has no disconjugacy, nystagmus, or appendicular ataxia. There is no weakness. CT head shows no acute intracranial process. She is not an Alteplase candidate as she has no disabling symptoms. At this time the differential for her transient diplopia and vertigo would include TIA/stroke of the posterior circulation. The diffuse numbness/tingling is unlikely to be from stroke and this may represent a migraine phenomena given she had a similar episode like this one month ago. I favor that her headache is a migraine since, despite it is  severe and abrupt, it is not the worst headache of her life and she has been having identical headaches like this recurrently with migranous features. Demyelinating disease would be a consideration, but seems less likely. CTA head and neck were obtained and on my review show no LVO or vascular pathology. Final radiology reads are pending. I do not think her headache represents any significant vascular pathology like SAH, DVST, or RCVS given her imaging results and history. Labs in the ER are essentially unremarkable. At this time I recommend admission for further evaluation and treatment of her symptoms. I spoke to PA Sponseller of the ER about my assessment and recommendations who reported understanding.  CT HEAD: Showed No Acute Hemorrhage or Acute Core Infarct  Metrics: [facility] Notification Time: 09/08/2020 11:14:57 Stamp Time: 09/08/2020 11:16:07 Callback Response Time: 09/08/2020 11:18:06  Our recommendations are outlined below.  Recommendations:     .  Recommend Euglycmiea, and Euthermia     .  Recommend cardiac telemetry     .  Recommend q4h neuro-checks     .  Recommend HOB 30 degrees     .  Recommend MRI Brain W/WO to evaluate for stroke or other intracranial process to explain her symptoms     .  Recommend TTE, A1c, and Lipid panel     .  Continue home Aspirin pending swallow screen     .  Continue home Atorvastatin pending swallow screen, titrate dose to an LDL <70     .  Recommend permissive HTN up to 220/120 for now, if MRI Brain negative  for infarct can lower as indicated from a medical perspective, if >3020mmHg change in systolic BP or >7015mmHg change in diastolic BP please ensure nursing notifies the primary team and then Neurology if change in exam or other concern by primary team     .  Can give Compazine 12.5mg  q8h PRN for 3 doses with Benadryl 25mg  q8h PRN for 3 doses for likely migraine headache, please ensure QTc ok first     .  Continue home Nortriptyline pending  swallow screen     .  Recommend smoking cessation     .  Alert Neurology immediately with any neuro-worsening     .  This patient will need Neuro f/u while inhouse, recommend local Neurology follow up for further recommendations, if none available please repeat a consult to TS tomorrow 09/09/2020 for further recommendations   Imaging Studies:     .  MRI Head with and Without Contrast     .  Echocardiogram - Transthoracic Echocardiogram  Therapies:     .  Physical Therapy, Occupational Therapy, Speech Therapy Assessment When Applicable  Other WorkUp:     .  Infectious/metabolic workup per primary team  Disposition: Neurology Follow Up Recommended  Sign Out:     .  Discussed with Emergency Department Provider  ----------------------------------------------------------------------------------------------------  Chief Complaint: transient diplopia, vertigo, and light headedness  History of Present Illness: Patient is a 49 year old Female.  Tiffany Barry is a 49 y.o. lady with a pmh of tobacco abuse, previous aborted stroke in August of 2020 (received Alteplase), HLD, chronic low back pain, chronic migraine, and other medical issues who presents to the ER with symptoms concerning for stroke. The history is obtained from the patient. the patient was reportedly in her normal state of health until around 0900 this morning when she noted abrupt onset diplopia, vertigo, light headedness, and diffuse whole body paresthesias. She also felt like she was shivering and weak. She did not have LOC. She had to sit down due to the light headedness and then her significant other brought her into the ER for further evaluation. She reports the diplopia only lasted a couple of minutes before resolving. This was horizontal diplopia and she did not try to see if it would resolve by closing one eye. Her other symptoms took about 2 hours to completely resolve. There was no slurred speech. Currently, the only  symptoms she has are an ongoing shaking sensation and an ongoing headache. She dos feel diffusely weak. The headache is located in the b/l occiput and was rated a 10/10 at its maximal severity today. She says it was sudden in onset reaching maximal intensity immediately. She says this is not the worst headache of her life. She has also been having headaches similar to this that have been occurring daily over the last week. It comes on abruptly for 5-10 minutes before going away, but then will wax and wane for a few hours. There is light and sound sensitivity. There is no nausea. It is described as a squeezing type pain. Before the last week she maybe had a headache like this about 1 month ago. She reports these headaches started after her stroke in August of 2020, but up until recently had been improving. She has been taking Fioricet at times for this as an out-patient. Most recently she has been taking Aleve. Her home medications include Aspirin, Prilosec, Meloxicam, Nortriptyline, Rimegepant PRN, and Fioricet PRN. She no longer takes any estrogen supplementation and does  not use illicit drugs. She has two alcoholic beverages a day and continues to smoke tobacco. Of note, in reviewing her atrium notes she had an episode 1 month ago of diffuse numbness, light headedness/dizziness, tingling in her hands and face, poor memory, a high sensation, and some twitching/jerking. She states this was attributed to her Lyrica which she was instructed to stop. There was no diplopia with this episode. She follows with out-patient Neurology for chronic migraine. Previous MRI Brain from 08/03/2020 was negative for acute infarct. She reports also having a myelogram a few weeks ago because of ongoing pain in her legs with tingling and pain radiating down from her back down the back of both of her legs.    Past Medical History:     . Hyperlipidemia     . There is NO history of Hypertension     . There is NO history of Diabetes  Mellitus     . There is NO history of Atrial Fibrillation     . There is NO history of Coronary Artery Disease     . There is NO history of Stroke  Anticoagulant use:  No  Antiplatelet use: Aspirin  Labs: AS discussed above  Radiology:  CT Head 09/08/2020 IMPRESSION: Unremarkable noncontrast head CT.  CTA Neck 09/08/2020 IMPRESSION: 1. No acute abnormality. 2. No significant (greater than 50%) stenosis or occlusion.  CTA Head 09/08/2020 Pending  Examination: BP(154/98), Pulse(77), Blood Glucose(91) 1A: Level of Consciousness - Alert; keenly responsive + 0 1B: Ask Month and Age - Both Questions Right + 0 1C: Blink Eyes & Squeeze Hands - Performs Both Tasks + 0 2: Test Horizontal Extraocular Movements - Normal + 0 3: Test Visual Fields - No Visual Loss + 0 4: Test Facial Palsy (Use Grimace if Obtunded) - Normal symmetry + 0 5A: Test Left Arm Motor Drift - No Drift for 10 Seconds + 0 5B: Test Right Arm Motor Drift - No Drift for 10 Seconds + 0 6A: Test Left Leg Motor Drift - No Drift for 5 Seconds + 0 6B: Test Right Leg Motor Drift - No Drift for 5 Seconds + 0 7: Test Limb Ataxia (FNF/Heel-Shin) - No Ataxia + 0 8: Test Sensation - Normal; No sensory loss + 0 9: Test Language/Aphasia - Normal; No aphasia + 0 10: Test Dysarthria - Normal + 0 11: Test Extinction/Inattention - No abnormality + 0  NIHSS Score: 0   Mental status is intact.  Patient is A&Ox3.   There is no receptive or expressive aphasia.  Repetition is intact.  No anomia.       EOMI without nystagmus.  PERL.  VF full OU.  There is no facial weakness.   No dysarthria.   Tongue midline. Sensation intact in V1-V3 to light touch b/l.     Easily anti-gravity in all 4 extremities.  There is no pronator drift.  B/l LE's tremble some with activation.  Pain limits evalaution of LE's somewhat.  No signs of convulsions or seizure activity.   Strength is 5/5 in b/l grip, biceps, triceps, hip flexors,  plantarflexors, and dorsiflexors.    Sensation intact to light touch in all 4 extremities.  Diffuse tingling reported in all extremities.     No dysmetria on FNF b/l.   No dysmetria on heel to shin b/l.     Slow and cautious narrow based gait.    Patient/Family was informed the Neurology Consult would occur via TeleHealth consult by way of interactive audio  and video telecommunications and consented to receiving care in this manner.  Patient is being evaluated for possible acute neurologic impairment and high probability of imminent or life-threatening deterioration. I spent total of 60 minutes providing care to this patient, including time for face to face visit via telemedicine, review of medical records, imaging studies and discussion of findings with providers, the patient and/or family.   Dr Tiffany Barry   [facility] [phone]  Case [id number]

## 2020-09-08 NOTE — ED Triage Notes (Signed)
LKW 0800 today. Pt states she went to work and at 0900 she had generalized numbness, weakness and dizziness. HX of stroke in the past.

## 2020-09-08 NOTE — ED Provider Notes (Signed)
This patient is a very pleasant 49 year old female, she is an active smoker, she has had a prior ischemic stroke requiring TPA according to her report approximately 1 year ago.  I do not have the records of this visit.  She states that she still takes a baby aspirin, she was in her usual state of health this morning when she drove herself to work, while she was at work she developed acute onset of headache diplopia and vertigo, she had to sit down, this eventually resolved but she had a significant other bring her to the hospital for evaluation because she has diffuse paresthesias and tingling of her whole body.  She does not have any lateralizing symptoms, her vision is resolved, she has no chest pain shortness of breath or systemic symptoms.  She has been compliant with her medications according to her report.  Pt endorses LSN at 9 AM to me  Speech is clear, cranial nerves III through XII are intact, memory is intact, strength is normal in all 4 extremities including grips, sensation is intact to light touch and pinprick in all 4 extremities. Coordination as tested by finger-nose-finger is normal, no limb ataxia. Normal gait, normal reflexes at the patellar tendons bilaterally  Clear heart and lung sounds, no tachycardia, normal pulses, no edema  The patient no longer has diplopia, cranial nerves III through XII are unremarkable however given her history of stroke, headache with neurologic symptoms we will get teleneurology to evaluate and get a CT scan of the brain.  We will need to evaluate for hemorrhage, she has an NIH scale of 0 at this time.   EKG Interpretation  Date/Time:  Saturday September 08 2020 10:31:16 EDT Ventricular Rate:  76 PR Interval:    QRS Duration: 88 QT Interval:  378 QTC Calculation: 425 R Axis:   75 Text Interpretation: Normal ECG since last tracing no significant change Confirmed by Eber Hong ([id number]) on 09/08/2020 10:37:10 AM      Medical screening  examination/treatment/procedure(s) were conducted as a shared visit with non-physician practitioner(s) and myself.  I personally evaluated the patient during the encounter.  Clinical Impression:   Final diagnoses:  None         Eber Hong, MD 09/15/20 [phone]
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
## 2020-09-08 NOTE — Discharge Instructions (Addendum)
Your care team's medical recommendation is that you need to be admitted to the hospital for further work-up and evaluation for potential stroke, including but not limited to an MRI of the brain and an echocardiogram of your heart.  You have refused admission and chosen to leave against medical advice, and have refused any further medical care or work-up.  You have voiced your understanding that your decision to leave against my medical recommendation and prior to completed evaluation may result in undesired medical complications.  Your symptoms this morning are concerning for potential stroke.  Given your history of prior stroke my level of a concern is elevated.  You have voiced understanding that delay of identification of potential stroke can result in catastrophic neurologic deficits or even death.  As you are choosing to refuse admission to the hospital, I would strongly encourage you to follow-up with your primary care provider and your neurologist immediately for outpatient MRI of the brain and echocardiogram of your heart in the next 72 hours.  You were administered a single dose of antiplatelet medication while in the emergency department, you should also discuss this with your neurologist.  You should return to the emergency department immediately should you develop any further neurologic symptoms or worsening of your symptoms, such as blurry or double vision, severe headache, numbness or tingling in your extremities, weakness, facial droop, difficulty speaking, chest pain, shortness of breath, palpitations.  You are choosing to leave your own free will, you are welcome to return to the emergency department at any time.
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
## 2020-09-09 ENCOUNTER — Encounter: Payer: Self-pay | Admitting: Family Medicine

## 2020-09-09 DIAGNOSIS — Z8673 Personal history of transient ischemic attack (TIA), and cerebral infarction without residual deficits: Secondary | ICD-10-CM

## 2020-09-10 ENCOUNTER — Ambulatory Visit: Payer: Medicaid Other | Admitting: Family Medicine

## 2020-09-10 NOTE — Addendum Note (Signed)
Addended by: Lillia Carmel on: 09/10/2020 09:43 AM   Modules accepted: Orders

## 2020-09-28 ENCOUNTER — Ambulatory Visit (HOSPITAL_COMMUNITY): Payer: Medicaid Other

## 2020-09-28 ENCOUNTER — Encounter (HOSPITAL_COMMUNITY): Payer: Self-pay

## 2020-10-08 ENCOUNTER — Ambulatory Visit (HOSPITAL_COMMUNITY)
Admission: RE | Admit: 2020-10-08 | Discharge: 2020-10-08 | Disposition: A | Discharge status: Home / Self Care | Payer: Medicaid Other | Source: Ambulatory Visit | Attending: Family Medicine | Admitting: Family Medicine

## 2020-10-08 ENCOUNTER — Other Ambulatory Visit: Payer: Self-pay

## 2020-10-08 ENCOUNTER — Encounter: Payer: Self-pay | Admitting: Family Medicine

## 2020-10-08 DIAGNOSIS — Z8673 Personal history of transient ischemic attack (TIA), and cerebral infarction without residual deficits: Secondary | ICD-10-CM | POA: Diagnosis not present

## 2020-10-08 LAB — ECHOCARDIOGRAM COMPLETE
AR max vel: 1.84 cm2
AV Area VTI: 1.97 cm2
AV Area mean vel: 1.75 cm2
AV Mean grad: 2.5 mmHg
AV Peak grad: 5.6 mmHg
Ao pk vel: 1.19 m/s
Area-P 1/2: 3.42 cm2
S' Lateral: 2.22 cm

## 2020-10-08 NOTE — Progress Notes (Signed)
*  PRELIMINARY RESULTS* Echocardiogram 2D Echocardiogram has been performed.  Stacey Drain 10/08/2020, 10:18 AM

## 2020-10-10 ENCOUNTER — Ambulatory Visit (HOSPITAL_COMMUNITY): Payer: Medicaid Other

## 2020-10-17 ENCOUNTER — Ambulatory Visit (HOSPITAL_COMMUNITY)
Admission: RE | Admit: 2020-10-17 | Discharge: 2020-10-17 | Disposition: A | Discharge status: Home / Self Care | Payer: Medicaid Other | Source: Ambulatory Visit | Attending: Family Medicine | Admitting: Family Medicine

## 2020-10-17 ENCOUNTER — Other Ambulatory Visit: Payer: Self-pay

## 2020-10-17 DIAGNOSIS — J0120 Acute ethmoidal sinusitis, unspecified: Secondary | ICD-10-CM | POA: Diagnosis not present

## 2020-10-17 DIAGNOSIS — J322 Chronic ethmoidal sinusitis: Secondary | ICD-10-CM | POA: Diagnosis not present

## 2020-10-17 DIAGNOSIS — Z8673 Personal history of transient ischemic attack (TIA), and cerebral infarction without residual deficits: Secondary | ICD-10-CM | POA: Diagnosis not present

## 2020-10-17 DIAGNOSIS — Z86718 Personal history of other venous thrombosis and embolism: Secondary | ICD-10-CM | POA: Diagnosis not present

## 2020-10-17 MED ORDER — GADOBUTROL 1 MMOL/ML IV SOLN
6.0000 mL | Freq: Once | INTRAVENOUS | Status: AC | PRN
  Administered 2020-10-17: 6 mL via INTRAVENOUS

## 2020-10-18 ENCOUNTER — Telehealth: Payer: Self-pay | Admitting: Family Medicine

## 2020-10-18 NOTE — Telephone Encounter (Signed)
Brain MRI was normal.  No evidence of a stroke.

## 2020-10-21 ENCOUNTER — Other Ambulatory Visit: Payer: Self-pay | Admitting: Adult Health

## 2020-10-31 ENCOUNTER — Other Ambulatory Visit: Payer: Self-pay

## 2020-10-31 ENCOUNTER — Ambulatory Visit (HOSPITAL_COMMUNITY): Payer: Medicaid Other | Attending: Orthopedic Surgery | Admitting: Physical Therapy

## 2020-10-31 DIAGNOSIS — M5441 Lumbago with sciatica, right side: Secondary | ICD-10-CM | POA: Insufficient documentation

## 2020-10-31 DIAGNOSIS — G8929 Other chronic pain: Secondary | ICD-10-CM | POA: Diagnosis not present

## 2020-10-31 DIAGNOSIS — M5442 Lumbago with sciatica, left side: Secondary | ICD-10-CM | POA: Diagnosis not present

## 2020-10-31 DIAGNOSIS — R262 Difficulty in walking, not elsewhere classified: Secondary | ICD-10-CM
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
## 2020-10-31 NOTE — Patient Instructions (Signed)
Access Code: [email or web address] URL: [email or web address] Date: 10/31/2020 Prepared by: Revonda Humphrey  Exercises Prone Hip Extension - 1 x daily - 7 x weekly - 2 sets - 15 reps - 5 hold Supine Bridge - 1 x daily - 7 x weekly - 2 sets - 15 reps - 5 hold Supine Double Knee to Chest - 1 x daily - 7 x weekly - 1 sets - 5 reps - 5 hold Active Straight Leg Raise with Quad Set - 1 x daily - 7 x weekly - 2 sets - 10 reps - 5 hold

## 2020-11-28 ENCOUNTER — Other Ambulatory Visit: Payer: Self-pay

## 2020-11-28 ENCOUNTER — Other Ambulatory Visit: Payer: Self-pay | Admitting: Women's Health

## 2020-11-29 MED ORDER — PROAIR HFA 108 (90 BASE) MCG/ACT IN AERS: 2.0000 | INHALATION_SPRAY | Freq: Four times a day (QID) | RESPIRATORY_TRACT | 3 refills | Status: DC | PRN

## 2020-12-26 ENCOUNTER — Ambulatory Visit (INDEPENDENT_AMBULATORY_CARE_PROVIDER_SITE_OTHER): Payer: Medicaid Other | Admitting: Adult Health

## 2020-12-26 ENCOUNTER — Encounter: Payer: Self-pay | Admitting: Adult Health

## 2020-12-26 ENCOUNTER — Other Ambulatory Visit: Payer: Self-pay

## 2020-12-26 VITALS — BP 125/68 | HR 71 | Ht 62.0 in | Wt 105.4 lb

## 2020-12-26 DIAGNOSIS — Z1211 Encounter for screening for malignant neoplasm of colon: Secondary | ICD-10-CM | POA: Diagnosis not present

## 2020-12-26 DIAGNOSIS — Z01419 Encounter for gynecological examination (general) (routine) without abnormal findings: Secondary | ICD-10-CM | POA: Insufficient documentation

## 2020-12-26 DIAGNOSIS — R350 Frequency of micturition: Secondary | ICD-10-CM | POA: Diagnosis not present

## 2020-12-26 LAB — POCT URINALYSIS DIPSTICK OB
Blood, UA: NEGATIVE
Glucose, UA: NEGATIVE
Ketones, UA: NEGATIVE
Leukocytes, UA: NEGATIVE
Nitrite, UA: NEGATIVE

## 2020-12-26 LAB — HEMOCCULT GUIAC POC 1CARD (OFFICE): Fecal Occult Blood, POC: NEGATIVE

## 2020-12-26 NOTE — Progress Notes (Signed)
Patient ID: Tiffany Barry, female   DOB: 04-14-1971, 50 y.o.   MRN: [id number] History of Present Illness: Tiffany Barry is a 50 year old white female, divorced, sp hysterectomy working at [employer], in for well woman gyn exam. She is applying to [employer].  She says she had a stroke last August.  PCP is Dr Prince Rome   Current Medications, Allergies, Past Medical History, Past Surgical History, Family History and Social History were reviewed in [employer] electronic medical record.     Review of Systems: Patient denies any hearing loss, fatigue, blurred vision, shortness of breath, chest pain, abdominal pain, problems with bowel movements,  or intercourse. No joint pain or mood swings. Has had headaches since stroke last August Has urinary frequency, had recent UTI    Physical Exam:BP 125/68 (BP Location: Right Arm, Patient Position: Sitting, Cuff Size: Normal)   Pulse 71   Ht 5\' 2"  (1.575 m)   Wt 105 lb 6.4 oz (47.8 kg)   LMP 06/20/2011   BMI 19.28 kg/m urine trace protein. General:  Well developed, well nourished, no acute distress Skin:  Warm and dry Neck:  Midline trachea, normal thyroid, good ROM, no lymphadenopathy Lungs; Clear to auscultation bilaterally Breast:  No dominant palpable mass, retraction, or nipple discharge Cardiovascular: Regular rate and rhythm Abdomen:  Soft, non tender, no hepatosplenomegaly Pelvic:  External genitalia is normal in appearance, no lesions.  The vagina is normal in appearance. Urethra has no lesions or masses. The cervix and uterus are absent. No adnexal masses or tenderness noted.Bladder is non tender, no masses felt. Rectal: Good sphincter tone, no polyps, or hemorrhoids felt.  Hemoccult negative. Extremities/musculoskeletal:  No swelling or varicosities noted, no clubbing or cyanosis Psych:  No mood changes, alert and cooperative,seems happy AA is 9, says she drinks at night, but not a problem Fall risk is low PHQ 9 score is 2 GAD 7 score is 0  Upstream  - 12/26/20 1050      Pregnancy Intention Screening   Does the patient want to become pregnant in the next year? No    Does the patient's partner want to become pregnant in the next year? No    Would the patient like to discuss contraceptive options today? No      Contraception Wrap Up   Current Method Female Sterilization   hysterectomy   End Method Female Sterilization   hyst   Contraception Counseling Provided No         Examination chaperoned by 02/23/21 RN  Impression and Plan: 1. Encounter for well woman exam with routine gynecological exam Physical in 1 year Labs with neurologist Get mammogram now She declines coloscopy  She declines COVID vaccine She is not ready to quit smoking.   2. Encounter for screening fecal occult blood testing   3. Urinary frequency

## 2021-03-08 ENCOUNTER — Ambulatory Visit
Admission: EM | Admit: 2021-03-08 | Discharge: 2021-03-08 | Disposition: A | Discharge status: Home / Self Care | Payer: Medicaid Other | Attending: Internal Medicine | Admitting: Internal Medicine

## 2021-03-08 ENCOUNTER — Encounter: Payer: Self-pay | Admitting: Emergency Medicine

## 2021-03-08 ENCOUNTER — Other Ambulatory Visit: Payer: Self-pay

## 2021-03-08 ENCOUNTER — Other Ambulatory Visit: Payer: Self-pay | Admitting: Adult Health

## 2021-03-08 DIAGNOSIS — J309 Allergic rhinitis, unspecified: Secondary | ICD-10-CM

## 2021-03-08 MED ORDER — BENZONATATE 100 MG PO CAPS: 100.0000 mg | ORAL_CAPSULE | Freq: Three times a day (TID) | ORAL | 0 refills | Status: DC

## 2021-03-08 MED ORDER — FLUTICASONE PROPIONATE 50 MCG/ACT NA SUSP: 1.0000 | Freq: Every day | NASAL | 1 refills | Status: DC

## 2021-03-08 MED ORDER — LORATADINE 10 MG PO TABS: 10.0000 mg | ORAL_TABLET | Freq: Every day | ORAL | 2 refills | Status: DC

## 2021-03-08 MED ORDER — GUAIFENESIN ER 600 MG PO TB12: 600.0000 mg | ORAL_TABLET | Freq: Two times a day (BID) | ORAL | 0 refills | Status: AC

## 2021-03-08 NOTE — ED Triage Notes (Signed)
Productive cough with green sputum.  Nasal congestion and sore throat x 3 days.

## 2021-03-08 NOTE — Discharge Instructions (Addendum)
Please take the medications as prescribed Smoke cessation will help your symptoms Saline nasal spray at bedtime

## 2021-03-29 ENCOUNTER — Encounter: Payer: Self-pay | Admitting: Family Medicine

## 2021-04-16 MED ORDER — ATORVASTATIN CALCIUM 40 MG PO TABS
40.0000 mg | ORAL_TABLET | Freq: Every day | ORAL | 1 refills | Status: DC
Start: 2021-04-16 — End: 2022-02-08

## 2021-10-31 ENCOUNTER — Other Ambulatory Visit: Payer: Self-pay | Admitting: Adult Health

## 2021-11-04 ENCOUNTER — Other Ambulatory Visit: Payer: Self-pay | Admitting: Adult Health

## 2021-11-04 MED ORDER — ALBUTEROL SULFATE HFA 108 (90 BASE) MCG/ACT IN AERS: 2.0000 | INHALATION_SPRAY | Freq: Four times a day (QID) | RESPIRATORY_TRACT | 2 refills | Status: DC | PRN

## 2021-11-04 NOTE — Progress Notes (Signed)
Rx ventolin

## 2021-11-10 ENCOUNTER — Emergency Department (HOSPITAL_COMMUNITY): Payer: Medicaid Other

## 2021-11-10 ENCOUNTER — Emergency Department (HOSPITAL_COMMUNITY)
Admission: EM | Admit: 2021-11-10 | Discharge: 2021-11-10 | Disposition: A | Discharge status: Home / Self Care | Payer: Medicaid Other | Attending: Emergency Medicine | Admitting: Emergency Medicine

## 2021-11-10 ENCOUNTER — Ambulatory Visit
Admission: EM | Admit: 2021-11-10 | Discharge: 2021-11-10 | Disposition: A | Discharge status: Home / Self Care | Payer: Medicaid Other

## 2021-11-10 ENCOUNTER — Encounter (HOSPITAL_COMMUNITY): Payer: Self-pay | Admitting: Emergency Medicine

## 2021-11-10 ENCOUNTER — Other Ambulatory Visit: Payer: Self-pay

## 2021-11-10 DIAGNOSIS — J45909 Unspecified asthma, uncomplicated: Secondary | ICD-10-CM | POA: Diagnosis not present

## 2021-11-10 DIAGNOSIS — K625 Hemorrhage of anus and rectum: Secondary | ICD-10-CM | POA: Diagnosis not present

## 2021-11-10 DIAGNOSIS — K828 Other specified diseases of gallbladder: Secondary | ICD-10-CM | POA: Insufficient documentation

## 2021-11-10 DIAGNOSIS — R109 Unspecified abdominal pain: Secondary | ICD-10-CM | POA: Diagnosis present

## 2021-11-10 DIAGNOSIS — R1900 Intra-abdominal and pelvic swelling, mass and lump, unspecified site: Secondary | ICD-10-CM | POA: Diagnosis not present

## 2021-11-10 DIAGNOSIS — F17210 Nicotine dependence, cigarettes, uncomplicated: Secondary | ICD-10-CM | POA: Insufficient documentation

## 2021-11-10 DIAGNOSIS — K824 Cholesterolosis of gallbladder: Secondary | ICD-10-CM

## 2021-11-10 DIAGNOSIS — K8020 Calculus of gallbladder without cholecystitis without obstruction: Secondary | ICD-10-CM | POA: Diagnosis not present

## 2021-11-10 DIAGNOSIS — Z7982 Long term (current) use of aspirin: Secondary | ICD-10-CM | POA: Insufficient documentation

## 2021-11-10 DIAGNOSIS — K922 Gastrointestinal hemorrhage, unspecified: Secondary | ICD-10-CM | POA: Diagnosis not present

## 2021-11-10 DIAGNOSIS — Z90710 Acquired absence of both cervix and uterus: Secondary | ICD-10-CM | POA: Diagnosis not present

## 2021-11-10 DIAGNOSIS — I700 Atherosclerosis of aorta: Secondary | ICD-10-CM | POA: Diagnosis not present

## 2021-11-10 DIAGNOSIS — K829 Disease of gallbladder, unspecified: Secondary | ICD-10-CM | POA: Diagnosis not present

## 2021-11-10 DIAGNOSIS — K760 Fatty (change of) liver, not elsewhere classified: Secondary | ICD-10-CM | POA: Diagnosis not present

## 2021-11-10 LAB — CBC WITH DIFFERENTIAL/PLATELET
Abs Immature Granulocytes: 0.02 10*3/uL (ref 0.00–0.07)
Basophils Absolute: 0.1 10*3/uL (ref 0.0–0.1)
Basophils Relative: 1 %
Eosinophils Absolute: 0 10*3/uL (ref 0.0–0.5)
Eosinophils Relative: 0 %
HCT: 46.2 % — ABNORMAL HIGH (ref 36.0–46.0)
Hemoglobin: 15.6 g/dL — ABNORMAL HIGH (ref 12.0–15.0)
Immature Granulocytes: 0 %
Lymphocytes Relative: 28 %
Lymphs Abs: 2.1 10*3/uL (ref 0.7–4.0)
MCH: 34 pg (ref 26.0–34.0)
MCHC: 33.8 g/dL (ref 30.0–36.0)
MCV: 100.7 fL — ABNORMAL HIGH (ref 80.0–100.0)
Monocytes Absolute: 0.5 10*3/uL (ref 0.1–1.0)
Monocytes Relative: 6 %
Neutro Abs: 4.8 10*3/uL (ref 1.7–7.7)
Neutrophils Relative %: 65 %
Platelets: 285 10*3/uL (ref 150–400)
RBC: 4.59 MIL/uL (ref 3.87–5.11)
RDW: 11.9 % (ref 11.5–15.5)
WBC: 7.5 10*3/uL (ref 4.0–10.5)
nRBC: 0 % (ref 0.0–0.2)

## 2021-11-10 LAB — COMPREHENSIVE METABOLIC PANEL
ALT: 25 U/L (ref 0–44)
AST: 37 U/L (ref 15–41)
Albumin: 4.4 g/dL (ref 3.5–5.0)
Alkaline Phosphatase: 100 U/L (ref 38–126)
Anion gap: 10 (ref 5–15)
BUN: 6 mg/dL (ref 6–20)
CO2: 26 mmol/L (ref 22–32)
Calcium: 9.5 mg/dL (ref 8.9–10.3)
Chloride: 103 mmol/L (ref 98–111)
Creatinine, Ser: 0.66 mg/dL (ref 0.44–1.00)
GFR, Estimated: >60 mL/min (ref >60)
Glucose, Bld: 123 mg/dL — ABNORMAL HIGH (ref 70–99)
Potassium: 4.4 mmol/L (ref 3.5–5.1)
Sodium: 139 mmol/L (ref 135–145)
Total Bilirubin: 0.5 mg/dL (ref 0.3–1.2)
Total Protein: 7.5 g/dL (ref 6.5–8.1)

## 2021-11-10 LAB — TYPE AND SCREEN
ABO/RH(D): O POS
Antibody Screen: NEGATIVE

## 2021-11-10 LAB — PROTIME-INR
INR: 1 (ref 0.8–1.2)
Prothrombin Time: 12.7 seconds (ref 11.4–15.2)

## 2021-11-10 MED ORDER — SODIUM CHLORIDE 0.9 % IV BOLUS
1000.0000 mL | Freq: Once | INTRAVENOUS | Status: AC
  Administered 2021-11-10: 10:00:00 1000 mL via INTRAVENOUS

## 2021-11-10 MED ORDER — SODIUM CHLORIDE 0.9 % IV SOLN: INTRAVENOUS | Status: DC

## 2021-11-10 MED ORDER — IOHEXOL 300 MG/ML  SOLN
100.0000 mL | Freq: Once | INTRAMUSCULAR | Status: AC | PRN
  Administered 2021-11-10: 10:00:00 80 mL via INTRAVENOUS

## 2021-11-10 NOTE — ED Triage Notes (Signed)
Pt sent from urgent care for rectal bleeding, swollen abdomen x 2 days and tachycardia. Pt states rectal bleeding has been going on since Wednesday and it is bright red. Denies pain.
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
## 2021-11-10 NOTE — ED Notes (Signed)
Patient is being discharged from the Urgent Care and sent to the Emergency Department via private vehicle . Per NP, patient is in need of higher level of care due to GI bleeding. Patient is aware and verbalizes understanding of plan of care.  Vitals:   11/10/21 0835  BP: 115/87  Pulse: (!) 116  Resp: 16  Temp: (!) 96.1 F (35.6 C)  SpO2: 97%

## 2021-11-10 NOTE — ED Triage Notes (Signed)
Patient states her stomach has been swollen since yesterday. She has also been bleeding out of her anis since Wednesday. She took a laxative ( magnesium )  2 days ago and it made it bleed more. Cant eat feels full.  Denies Fever

## 2021-11-10 NOTE — Discharge Instructions (Addendum)
Given suspicion of a possible GI bleed any further work-up and evaluation in the setting of the emergency room.  Go immediately to Tiffany Barry, ER for further evaluation and further diagnostic imaging

## 2021-11-10 NOTE — ED Notes (Signed)
US at bedside

## 2021-11-19 DIAGNOSIS — K219 Gastro-esophageal reflux disease without esophagitis: Secondary | ICD-10-CM | POA: Diagnosis not present

## 2021-11-19 DIAGNOSIS — D122 Benign neoplasm of ascending colon: Secondary | ICD-10-CM | POA: Diagnosis not present

## 2021-11-19 DIAGNOSIS — K648 Other hemorrhoids: Secondary | ICD-10-CM | POA: Diagnosis not present

## 2021-11-19 DIAGNOSIS — R140 Abdominal distension (gaseous): Secondary | ICD-10-CM | POA: Diagnosis not present

## 2021-11-19 DIAGNOSIS — K2930 Chronic superficial gastritis without bleeding: Secondary | ICD-10-CM | POA: Diagnosis not present

## 2021-11-19 DIAGNOSIS — K625 Hemorrhage of anus and rectum: Secondary | ICD-10-CM | POA: Diagnosis not present

## 2021-12-30 ENCOUNTER — Ambulatory Visit (INDEPENDENT_AMBULATORY_CARE_PROVIDER_SITE_OTHER): Payer: Medicaid Other | Admitting: Adult Health

## 2021-12-30 ENCOUNTER — Other Ambulatory Visit: Payer: Self-pay

## 2021-12-30 ENCOUNTER — Encounter: Payer: Self-pay | Admitting: Adult Health

## 2021-12-30 VITALS — BP 118/66 | HR 78 | Ht 62.0 in | Wt 110.2 lb

## 2021-12-30 DIAGNOSIS — Z01419 Encounter for gynecological examination (general) (routine) without abnormal findings: Secondary | ICD-10-CM

## 2021-12-30 DIAGNOSIS — Z1231 Encounter for screening mammogram for malignant neoplasm of breast: Secondary | ICD-10-CM | POA: Diagnosis not present

## 2021-12-30 DIAGNOSIS — Z90710 Acquired absence of both cervix and uterus: Secondary | ICD-10-CM
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
## 2022-01-06 ENCOUNTER — Ambulatory Visit (HOSPITAL_COMMUNITY)
Admission: RE | Admit: 2022-01-06 | Discharge: 2022-01-06 | Disposition: A | Discharge status: Home / Self Care | Payer: Medicaid Other | Source: Ambulatory Visit | Attending: Adult Health | Admitting: Adult Health

## 2022-01-06 ENCOUNTER — Other Ambulatory Visit: Payer: Self-pay

## 2022-01-06 DIAGNOSIS — Z1231 Encounter for screening mammogram for malignant neoplasm of breast: Secondary | ICD-10-CM | POA: Diagnosis not present

## 2022-02-06 DIAGNOSIS — W19XXXA Unspecified fall, initial encounter: Secondary | ICD-10-CM | POA: Diagnosis not present

## 2022-02-06 DIAGNOSIS — R52 Pain, unspecified: Secondary | ICD-10-CM | POA: Diagnosis not present

## 2022-02-06 DIAGNOSIS — I959 Hypotension, unspecified: Secondary | ICD-10-CM | POA: Diagnosis not present

## 2022-02-07 ENCOUNTER — Inpatient Hospital Stay (HOSPITAL_COMMUNITY): Payer: Medicaid Other

## 2022-02-07 ENCOUNTER — Other Ambulatory Visit: Payer: Self-pay

## 2022-02-07 ENCOUNTER — Inpatient Hospital Stay (HOSPITAL_COMMUNITY): Payer: Medicaid Other | Admitting: Anesthesiology

## 2022-02-07 ENCOUNTER — Encounter (HOSPITAL_COMMUNITY)
Admission: EM | Disposition: A | Discharge status: Home / Self Care | Payer: Self-pay | Source: Home / Self Care | Attending: Family Medicine

## 2022-02-07 ENCOUNTER — Encounter (HOSPITAL_COMMUNITY): Payer: Self-pay

## 2022-02-07 ENCOUNTER — Emergency Department (HOSPITAL_COMMUNITY): Payer: Medicaid Other

## 2022-02-07 ENCOUNTER — Inpatient Hospital Stay (HOSPITAL_COMMUNITY)
Admission: EM | Admit: 2022-02-07 | Discharge: 2022-02-08 | DRG: 481 | Disposition: A | Discharge status: Home / Self Care | Payer: Medicaid Other | Attending: Family Medicine | Admitting: Family Medicine

## 2022-02-07 DIAGNOSIS — R739 Hyperglycemia, unspecified: Secondary | ICD-10-CM | POA: Diagnosis not present

## 2022-02-07 DIAGNOSIS — F17210 Nicotine dependence, cigarettes, uncomplicated: Secondary | ICD-10-CM | POA: Diagnosis present

## 2022-02-07 DIAGNOSIS — K219 Gastro-esophageal reflux disease without esophagitis: Secondary | ICD-10-CM | POA: Diagnosis present

## 2022-02-07 DIAGNOSIS — W06XXXA Fall from bed, initial encounter: Secondary | ICD-10-CM | POA: Diagnosis present

## 2022-02-07 DIAGNOSIS — Z7982 Long term (current) use of aspirin: Secondary | ICD-10-CM

## 2022-02-07 DIAGNOSIS — Y92009 Unspecified place in unspecified non-institutional (private) residence as the place of occurrence of the external cause: Secondary | ICD-10-CM | POA: Diagnosis not present

## 2022-02-07 DIAGNOSIS — Z833 Family history of diabetes mellitus: Secondary | ICD-10-CM

## 2022-02-07 DIAGNOSIS — D62 Acute posthemorrhagic anemia: Secondary | ICD-10-CM | POA: Diagnosis present

## 2022-02-07 DIAGNOSIS — F1090 Alcohol use, unspecified, uncomplicated: Secondary | ICD-10-CM | POA: Diagnosis not present

## 2022-02-07 DIAGNOSIS — Z8249 Family history of ischemic heart disease and other diseases of the circulatory system: Secondary | ICD-10-CM

## 2022-02-07 DIAGNOSIS — Z720 Tobacco use: Secondary | ICD-10-CM | POA: Diagnosis not present

## 2022-02-07 DIAGNOSIS — Z20822 Contact with and (suspected) exposure to covid-19: Secondary | ICD-10-CM | POA: Diagnosis present

## 2022-02-07 DIAGNOSIS — Z91018 Allergy to other foods: Secondary | ICD-10-CM

## 2022-02-07 DIAGNOSIS — S72141A Displaced intertrochanteric fracture of right femur, initial encounter for closed fracture: Secondary | ICD-10-CM

## 2022-02-07 DIAGNOSIS — W19XXXA Unspecified fall, initial encounter: Secondary | ICD-10-CM | POA: Diagnosis not present

## 2022-02-07 DIAGNOSIS — Z8673 Personal history of transient ischemic attack (TIA), and cerebral infarction without residual deficits: Secondary | ICD-10-CM | POA: Diagnosis not present

## 2022-02-07 DIAGNOSIS — E785 Hyperlipidemia, unspecified: Secondary | ICD-10-CM | POA: Diagnosis not present

## 2022-02-07 DIAGNOSIS — M1990 Unspecified osteoarthritis, unspecified site: Secondary | ICD-10-CM

## 2022-02-07 DIAGNOSIS — E875 Hyperkalemia: Secondary | ICD-10-CM | POA: Diagnosis not present

## 2022-02-07 DIAGNOSIS — M25551 Pain in right hip: Secondary | ICD-10-CM | POA: Diagnosis not present

## 2022-02-07 DIAGNOSIS — S72001A Fracture of unspecified part of neck of right femur, initial encounter for closed fracture: Secondary | ICD-10-CM | POA: Diagnosis not present

## 2022-02-07 DIAGNOSIS — R9431 Abnormal electrocardiogram [ECG] [EKG]: Secondary | ICD-10-CM | POA: Diagnosis not present

## 2022-02-07 DIAGNOSIS — S72141D Displaced intertrochanteric fracture of right femur, subsequent encounter for closed fracture with routine healing: Secondary | ICD-10-CM | POA: Diagnosis not present

## 2022-02-07 DIAGNOSIS — R718 Other abnormality of red blood cells: Secondary | ICD-10-CM

## 2022-02-07 DIAGNOSIS — E782 Mixed hyperlipidemia: Secondary | ICD-10-CM

## 2022-02-07 DIAGNOSIS — Z981 Arthrodesis status: Secondary | ICD-10-CM

## 2022-02-07 DIAGNOSIS — S72143A Displaced intertrochanteric fracture of unspecified femur, initial encounter for closed fracture: Secondary | ICD-10-CM

## 2022-02-07 DIAGNOSIS — J449 Chronic obstructive pulmonary disease, unspecified: Secondary | ICD-10-CM | POA: Diagnosis present

## 2022-02-07 DIAGNOSIS — T148XXA Other injury of unspecified body region, initial encounter: Secondary | ICD-10-CM

## 2022-02-07 HISTORY — PX: INTRAMEDULLARY (IM) NAIL INTERTROCHANTERIC: SHX5875

## 2022-02-07 LAB — CBC
HCT: 37.8 % (ref 36.0–46.0)
Hemoglobin: 12.4 g/dL (ref 12.0–15.0)
MCH: 33 pg (ref 26.0–34.0)
MCHC: 32.8 g/dL (ref 30.0–36.0)
MCV: 100.5 fL — ABNORMAL HIGH (ref 80.0–100.0)
Platelets: 241 10*3/uL (ref 150–400)
RBC: 3.76 MIL/uL — ABNORMAL LOW (ref 3.87–5.11)
RDW: 12.5 % (ref 11.5–15.5)
WBC: 6.2 10*3/uL (ref 4.0–10.5)
nRBC: 0 % (ref 0.0–0.2)

## 2022-02-07 LAB — COMPREHENSIVE METABOLIC PANEL
ALT: 25 U/L (ref 0–44)
AST: 38 U/L (ref 15–41)
Albumin: 3.5 g/dL (ref 3.5–5.0)
Alkaline Phosphatase: 109 U/L (ref 38–126)
Anion gap: 11 (ref 5–15)
BUN: 5 mg/dL — ABNORMAL LOW (ref 6–20)
CO2: 24 mmol/L (ref 22–32)
Calcium: 8.4 mg/dL — ABNORMAL LOW (ref 8.9–10.3)
Chloride: 102 mmol/L (ref 98–111)
Creatinine, Ser: 0.76 mg/dL (ref 0.44–1.00)
GFR, Estimated: >60 mL/min (ref >60)
Glucose, Bld: 122 mg/dL — ABNORMAL HIGH (ref 70–99)
Potassium: 3.6 mmol/L (ref 3.5–5.1)
Sodium: 137 mmol/L (ref 135–145)
Total Bilirubin: 0.4 mg/dL (ref 0.3–1.2)
Total Protein: 6.4 g/dL — ABNORMAL LOW (ref 6.5–8.1)

## 2022-02-07 LAB — VITAMIN B12: Vitamin B-12: 362 pg/mL (ref 180–914)

## 2022-02-07 LAB — FOLATE: Folate: 7.4 ng/mL (ref >5.9)

## 2022-02-07 LAB — APTT: aPTT: 25 seconds (ref 24–36)

## 2022-02-07 LAB — SURGICAL PCR SCREEN
MRSA, PCR: NEGATIVE
Staphylococcus aureus: NEGATIVE

## 2022-02-07 LAB — RESP PANEL BY RT-PCR (FLU A&B, COVID) ARPGX2
Influenza A by PCR: NEGATIVE
Influenza B by PCR: NEGATIVE
SARS Coronavirus 2 by RT PCR: NEGATIVE

## 2022-02-07 LAB — PHOSPHORUS: Phosphorus: 4.6 mg/dL (ref 2.5–4.6)

## 2022-02-07 LAB — MAGNESIUM: Magnesium: 1.6 mg/dL — ABNORMAL LOW (ref 1.7–2.4)

## 2022-02-07 LAB — HIV ANTIBODY (ROUTINE TESTING W REFLEX): HIV Screen 4th Generation wRfx: NONREACTIVE

## 2022-02-07 SURGERY — FIXATION, FRACTURE, INTERTROCHANTERIC, WITH INTRAMEDULLARY ROD
Anesthesia: General | Site: Hip | Laterality: Right

## 2022-02-07 MED ORDER — POLYETHYLENE GLYCOL 3350 17 G PO PACK
17.0000 g | PACK | Freq: Every day | ORAL | Status: DC
  Filled 2022-02-07: qty 1

## 2022-02-07 MED ORDER — NICOTINE 21 MG/24HR TD PT24
21.0000 mg | MEDICATED_PATCH | TRANSDERMAL | Status: DC
Start: 2022-02-07 — End: 2022-02-08

## 2022-02-07 MED ORDER — HYDROMORPHONE HCL 1 MG/ML IJ SOLN
1.0000 mg | Freq: Once | INTRAMUSCULAR | Status: AC
  Administered 2022-02-07: 1 mg via INTRAVENOUS
  Filled 2022-02-07: qty 1

## 2022-02-07 MED ORDER — PANTOPRAZOLE SODIUM 40 MG PO TBEC
40.0000 mg | DELAYED_RELEASE_TABLET | Freq: Every day | ORAL | Status: DC
  Administered 2022-02-08: 40 mg via ORAL
  Filled 2022-02-07: qty 1

## 2022-02-07 MED ORDER — PROPOFOL 10 MG/ML IV BOLUS
INTRAVENOUS | Status: AC
  Filled 2022-02-07: qty 20

## 2022-02-07 MED ORDER — ORAL CARE MOUTH RINSE: 15.0000 mL | Freq: Once | OROMUCOSAL | Status: DC

## 2022-02-07 MED ORDER — HYDROMORPHONE HCL 1 MG/ML IJ SOLN
0.5000 mg | INTRAMUSCULAR | Status: DC | PRN
  Administered 2022-02-07: 0.5 mg via INTRAVENOUS
  Filled 2022-02-07 (×2): qty 0.5

## 2022-02-07 MED ORDER — METOPROLOL TARTRATE 5 MG/5ML IV SOLN
INTRAVENOUS | Status: DC | PRN
Start: 2022-02-07 — End: 2022-02-07
  Administered 2022-02-07: 1 mg via INTRAVENOUS

## 2022-02-07 MED ORDER — LORAZEPAM 1 MG PO TABS
1.0000 mg | ORAL_TABLET | ORAL | Status: DC | PRN
  Administered 2022-02-08: 2 mg via ORAL
  Filled 2022-02-07: qty 2

## 2022-02-07 MED ORDER — CHLORHEXIDINE GLUCONATE 0.12 % MT SOLN: 15.0000 mL | Freq: Once | OROMUCOSAL | Status: DC

## 2022-02-07 MED ORDER — TRANEXAMIC ACID-NACL 1000-0.7 MG/100ML-% IV SOLN
1000.0000 mg | INTRAVENOUS | Status: AC
  Administered 2022-02-07: 1000 mg via INTRAVENOUS
  Filled 2022-02-07: qty 100

## 2022-02-07 MED ORDER — HYDROMORPHONE HCL 1 MG/ML IJ SOLN
INTRAMUSCULAR | Status: AC
  Filled 2022-02-07: qty 0.5

## 2022-02-07 MED ORDER — MIDAZOLAM HCL 2 MG/2ML IJ SOLN
INTRAMUSCULAR | Status: AC
  Filled 2022-02-07: qty 2

## 2022-02-07 MED ORDER — ONDANSETRON HCL 4 MG/2ML IJ SOLN
INTRAMUSCULAR | Status: DC | PRN
  Administered 2022-02-07: 4 mg via INTRAVENOUS

## 2022-02-07 MED ORDER — STERILE WATER FOR IRRIGATION IR SOLN
Status: DC | PRN
  Administered 2022-02-07 (×2): 500 mL

## 2022-02-07 MED ORDER — PANTOPRAZOLE SODIUM 40 MG IV SOLR
40.0000 mg | INTRAVENOUS | Status: DC
  Administered 2022-02-07: 40 mg via INTRAVENOUS
  Filled 2022-02-07: qty 10

## 2022-02-07 MED ORDER — OXYCODONE-ACETAMINOPHEN 7.5-325 MG PO TABS
1.0000 | ORAL_TABLET | ORAL | Status: DC | PRN
  Administered 2022-02-07 – 2022-02-08 (×3): 1 via ORAL
  Filled 2022-02-07 (×3): qty 1

## 2022-02-07 MED ORDER — DIAZEPAM 5 MG PO TABS
5.0000 mg | ORAL_TABLET | Freq: Once | ORAL | Status: AC
Start: 2022-02-07 — End: 2022-02-07
  Administered 2022-02-07: 5 mg via ORAL
  Filled 2022-02-07: qty 1

## 2022-02-07 MED ORDER — FOLIC ACID 1 MG PO TABS
1.0000 mg | ORAL_TABLET | Freq: Every day | ORAL | Status: DC
  Administered 2022-02-07 – 2022-02-08 (×2): 1 mg via ORAL
  Filled 2022-02-07 (×2): qty 1

## 2022-02-07 MED ORDER — MEPERIDINE HCL 50 MG/ML IJ SOLN: 6.2500 mg | INTRAMUSCULAR | Status: DC | PRN

## 2022-02-07 MED ORDER — LIDOCAINE HCL (CARDIAC) PF 100 MG/5ML IV SOSY
PREFILLED_SYRINGE | INTRAVENOUS | Status: DC | PRN
  Administered 2022-02-07: 50 mg via INTRAVENOUS

## 2022-02-07 MED ORDER — THIAMINE HCL 100 MG/ML IJ SOLN
100.0000 mg | Freq: Every day | INTRAMUSCULAR | Status: DC
  Filled 2022-02-07: qty 2

## 2022-02-07 MED ORDER — DEXAMETHASONE SODIUM PHOSPHATE 10 MG/ML IJ SOLN
INTRAMUSCULAR | Status: DC | PRN
Start: 2022-02-07 — End: 2022-02-07
  Administered 2022-02-07: 10 mg via INTRAVENOUS

## 2022-02-07 MED ORDER — THIAMINE HCL 100 MG PO TABS
100.0000 mg | ORAL_TABLET | Freq: Every day | ORAL | Status: DC
  Filled 2022-02-07 (×2): qty 1

## 2022-02-07 MED ORDER — CEFAZOLIN SODIUM-DEXTROSE 2-4 GM/100ML-% IV SOLN
2.0000 g | INTRAVENOUS | Status: AC
  Administered 2022-02-07: 2 g via INTRAVENOUS
  Filled 2022-02-07: qty 100

## 2022-02-07 MED ORDER — SUGAMMADEX SODIUM 500 MG/5ML IV SOLN
INTRAVENOUS | Status: DC | PRN
  Administered 2022-02-07: 102.4 mg via INTRAVENOUS

## 2022-02-07 MED ORDER — HYDROMORPHONE HCL 1 MG/ML IJ SOLN
0.5000 mg | INTRAMUSCULAR | Status: DC | PRN
  Administered 2022-02-07: 0.5 mg via INTRAVENOUS
  Filled 2022-02-07: qty 0.5

## 2022-02-07 MED ORDER — SENNOSIDES-DOCUSATE SODIUM 8.6-50 MG PO TABS
2.0000 | ORAL_TABLET | Freq: Two times a day (BID) | ORAL | Status: DC
  Administered 2022-02-07: 09:00:00 2 via ORAL
  Filled 2022-02-07 (×3): qty 2

## 2022-02-07 MED ORDER — CHLORHEXIDINE GLUCONATE CLOTH 2 % EX PADS
6.0000 | MEDICATED_PAD | Freq: Every day | CUTANEOUS | Status: DC
  Administered 2022-02-07 – 2022-02-08 (×2): 6 via TOPICAL

## 2022-02-07 MED ORDER — FENTANYL CITRATE (PF) 100 MCG/2ML IJ SOLN
INTRAMUSCULAR | Status: AC
  Filled 2022-02-07: qty 2

## 2022-02-07 MED ORDER — POTASSIUM CHLORIDE CRYS ER 20 MEQ PO TBCR
40.0000 meq | EXTENDED_RELEASE_TABLET | Freq: Once | ORAL | Status: AC
Start: 2022-02-07 — End: 2022-02-07
  Administered 2022-02-07: 40 meq via ORAL
  Filled 2022-02-07: qty 2

## 2022-02-07 MED ORDER — PROPOFOL 10 MG/ML IV BOLUS
INTRAVENOUS | Status: DC | PRN
  Administered 2022-02-07: 150 mg via INTRAVENOUS

## 2022-02-07 MED ORDER — IPRATROPIUM-ALBUTEROL 0.5-2.5 (3) MG/3ML IN SOLN
3.0000 mL | Freq: Four times a day (QID) | RESPIRATORY_TRACT | Status: DC | PRN
Start: 2022-02-07 — End: 2022-02-08

## 2022-02-07 MED ORDER — ALBUTEROL SULFATE (2.5 MG/3ML) 0.083% IN NEBU
2.5000 mg | INHALATION_SOLUTION | RESPIRATORY_TRACT | Status: DC | PRN
Start: 2022-02-07 — End: 2022-02-07

## 2022-02-07 MED ORDER — PHENYLEPHRINE HCL (PRESSORS) 10 MG/ML IV SOLN
INTRAVENOUS | Status: DC | PRN
  Administered 2022-02-07 (×2): 80 ug via INTRAVENOUS
  Administered 2022-02-07 (×2): 40 ug via INTRAVENOUS

## 2022-02-07 MED ORDER — HYDROMORPHONE HCL 1 MG/ML IJ SOLN
1.0000 mg | INTRAMUSCULAR | Status: DC | PRN
  Administered 2022-02-07: 12:00:00 1 mg via INTRAVENOUS
  Filled 2022-02-07: qty 1

## 2022-02-07 MED ORDER — MAGNESIUM SULFATE 2 GM/50ML IV SOLN
2.0000 g | Freq: Once | INTRAVENOUS | Status: AC
Start: 2022-02-07 — End: 2022-02-07
  Administered 2022-02-07: 2 g via INTRAVENOUS
  Filled 2022-02-07: qty 50

## 2022-02-07 MED ORDER — ASPIRIN EC 81 MG PO TBEC
81.0000 mg | DELAYED_RELEASE_TABLET | Freq: Two times a day (BID) | ORAL | Status: DC
  Administered 2022-02-08: 81 mg via ORAL
  Filled 2022-02-07: qty 1

## 2022-02-07 MED ORDER — LACTATED RINGERS IV SOLN: INTRAVENOUS | Status: DC

## 2022-02-07 MED ORDER — IPRATROPIUM-ALBUTEROL 0.5-2.5 (3) MG/3ML IN SOLN
3.0000 mL | Freq: Three times a day (TID) | RESPIRATORY_TRACT | Status: DC
  Administered 2022-02-07: 3 mL via RESPIRATORY_TRACT
  Filled 2022-02-07: qty 3

## 2022-02-07 MED ORDER — FENTANYL CITRATE (PF) 250 MCG/5ML IJ SOLN
INTRAMUSCULAR | Status: AC
  Filled 2022-02-07: qty 5

## 2022-02-07 MED ORDER — ONDANSETRON HCL 4 MG/2ML IJ SOLN: 4.0000 mg | Freq: Once | INTRAMUSCULAR | Status: DC | PRN

## 2022-02-07 MED ORDER — LORAZEPAM 2 MG/ML IJ SOLN
1.0000 mg | INTRAMUSCULAR | Status: DC | PRN
  Administered 2022-02-07: 2 mg via INTRAVENOUS
  Filled 2022-02-07: qty 2

## 2022-02-07 MED ORDER — METHOCARBAMOL 500 MG PO TABS
750.0000 mg | ORAL_TABLET | Freq: Three times a day (TID) | ORAL | Status: DC
  Administered 2022-02-07 – 2022-02-08 (×2): 750 mg via ORAL
  Filled 2022-02-07 (×3): qty 2

## 2022-02-07 MED ORDER — FENTANYL CITRATE (PF) 100 MCG/2ML IJ SOLN
INTRAMUSCULAR | Status: DC | PRN
  Administered 2022-02-07 (×3): 50 ug via INTRAVENOUS
  Administered 2022-02-07: 100 ug via INTRAVENOUS

## 2022-02-07 MED ORDER — SUCCINYLCHOLINE CHLORIDE 200 MG/10ML IV SOSY
PREFILLED_SYRINGE | INTRAVENOUS | Status: DC | PRN
  Administered 2022-02-07: 100 mg via INTRAVENOUS

## 2022-02-07 MED ORDER — ADULT MULTIVITAMIN W/MINERALS CH
1.0000 | ORAL_TABLET | Freq: Every day | ORAL | Status: DC
  Filled 2022-02-07: qty 1

## 2022-02-07 MED ORDER — MIDAZOLAM HCL 5 MG/5ML IJ SOLN
INTRAMUSCULAR | Status: DC | PRN
  Administered 2022-02-07: 2 mg via INTRAVENOUS

## 2022-02-07 MED ORDER — SODIUM CHLORIDE 0.9 % IR SOLN
Status: DC | PRN
  Administered 2022-02-07: 1000 mL

## 2022-02-07 MED ORDER — HYDROMORPHONE HCL 1 MG/ML IJ SOLN
0.2500 mg | INTRAMUSCULAR | Status: DC | PRN
  Administered 2022-02-07: 0.5 mg via INTRAVENOUS

## 2022-02-07 MED ORDER — ONDANSETRON HCL 4 MG/2ML IJ SOLN
4.0000 mg | Freq: Four times a day (QID) | INTRAMUSCULAR | Status: DC | PRN
Start: 2022-02-07 — End: 2022-02-08

## 2022-02-07 MED ORDER — SODIUM CHLORIDE 0.9 % IV SOLN: INTRAVENOUS | Status: DC

## 2022-02-07 MED ORDER — EPHEDRINE SULFATE (PRESSORS) 50 MG/ML IJ SOLN
INTRAMUSCULAR | Status: DC | PRN
  Administered 2022-02-07: 5 mg via INTRAVENOUS

## 2022-02-07 MED ORDER — BUPIVACAINE HCL (PF) 0.5 % IJ SOLN
INTRAMUSCULAR | Status: DC | PRN
  Administered 2022-02-07: 30 mL

## 2022-02-07 MED ORDER — GLYCOPYRROLATE 0.2 MG/ML IJ SOLN
INTRAMUSCULAR | Status: DC | PRN
  Administered 2022-02-07: .1 mg via INTRAVENOUS

## 2022-02-07 SURGICAL SUPPLY — 55 items
APL PRP STRL LF DISP 70% ISPRP (MISCELLANEOUS) ×1
BIT DRILL 4.0X280 (BIT) ×1 IMPLANT
BLADE SURG SZ10 CARB STEEL (BLADE) ×4 IMPLANT
BNDG GAUZE ELAST 4 BULKY (GAUZE/BANDAGES/DRESSINGS) ×2 IMPLANT
BRUSH SCRUB EZ W/ULTRADEX 3%PC (MISCELLANEOUS) ×1 IMPLANT
CHLORAPREP W/TINT 26 (MISCELLANEOUS) ×2 IMPLANT
CLOTH BEACON ORANGE TIMEOUT ST (SAFETY) ×2 IMPLANT
COVER LIGHT HANDLE STERIS (MISCELLANEOUS) ×4 IMPLANT
COVER MAYO STAND XLG (MISCELLANEOUS) ×2 IMPLANT
COVER PERINEAL POST (MISCELLANEOUS) ×2 IMPLANT
DRAPE HALF SHEET 40X57 (DRAPES) ×2 IMPLANT
DRSG PAD ABDOMINAL 8X10 ST (GAUZE/BANDAGES/DRESSINGS) ×2 IMPLANT
DRSG TEGADERM 4X4.75 (GAUZE/BANDAGES/DRESSINGS) ×8 IMPLANT
ELECT REM PT RETURN 9FT ADLT (ELECTROSURGICAL) ×2
ELECTRODE REM PT RTRN 9FT ADLT (ELECTROSURGICAL) ×1 IMPLANT
GAUZE SPONGE 4X4 12PLY STRL (GAUZE/BANDAGES/DRESSINGS) ×2 IMPLANT
GAUZE XEROFORM 1X8 LF (GAUZE/BANDAGES/DRESSINGS) ×3 IMPLANT
GLOVE SRG 8 PF TXTR STRL LF DI (GLOVE) ×1 IMPLANT
GLOVE SURG POLYISO LF SZ8 (GLOVE) ×4 IMPLANT
GLOVE SURG UNDER POLY LF SZ7 (GLOVE) ×4 IMPLANT
GLOVE SURG UNDER POLY LF SZ8 (GLOVE) ×2
GOWN STRL REUS W/ TWL XL LVL3 (GOWN DISPOSABLE) ×1 IMPLANT
GOWN STRL REUS W/TWL LRG LVL3 (GOWN DISPOSABLE) ×2 IMPLANT
GOWN STRL REUS W/TWL XL LVL3 (GOWN DISPOSABLE) ×2
GUIDE PIN 3.2X330 (PIN) ×2
GUIDEWIRE BALL NOSE 3.0X900 (WIRE) ×2
GUIDEWIRE ORTH 900X3XBALL NOSE (WIRE) IMPLANT
INST SET MAJOR BONE (KITS) ×2 IMPLANT
KIT BLADEGUARD II DBL (SET/KITS/TRAYS/PACK) ×2 IMPLANT
KIT TURNOVER CYSTO (KITS) ×2 IMPLANT
MANIFOLD NEPTUNE II (INSTRUMENTS) ×2 IMPLANT
MARKER SKIN DUAL TIP RULER LAB (MISCELLANEOUS) ×2 IMPLANT
NAIL ES TROCH RT 10X33X125 (Nail) ×1 IMPLANT
NDL HYPO 21X1.5 SAFETY (NEEDLE) ×1 IMPLANT
NEEDLE HYPO 21X1.5 SAFETY (NEEDLE) ×2 IMPLANT
NS IRRIG 1000ML POUR BTL (IV SOLUTION) ×2 IMPLANT
PACK BASIC III (CUSTOM PROCEDURE TRAY) ×2
PACK SRG BSC III STRL LF ECLPS (CUSTOM PROCEDURE TRAY) ×1 IMPLANT
PAD ARMBOARD 7.5X6 YLW CONV (MISCELLANEOUS) ×2 IMPLANT
PENCIL SMOKE EVACUATOR COATED (MISCELLANEOUS) ×2 IMPLANT
PIN GUIDE 3.2X330 (PIN) IMPLANT
SCREW LOCK CORT 5X34 (Screw) ×1 IMPLANT
SCREW LOCK LAG 10.5X95 GALILEO (Screw) ×1 IMPLANT
SET BASIN LINEN APH (SET/KITS/TRAYS/PACK) ×2 IMPLANT
SPONGE T-LAP 18X18 ~~LOC~~+RFID (SPONGE) ×4 IMPLANT
STAPLER VISISTAT (STAPLE) ×1 IMPLANT
SUT MON AB 2-0 CT1 36 (SUTURE) ×2 IMPLANT
SUT VIC AB 0 CT1 27 (SUTURE) ×2
SUT VIC AB 0 CT1 27XBRD ANTBC (SUTURE) ×1 IMPLANT
SYR 30ML LL (SYRINGE) ×2 IMPLANT
SYR BULB IRRIG 60ML STRL (SYRINGE) ×4 IMPLANT
TOOL ACTIVATION (INSTRUMENTS) ×1 IMPLANT
TRAY FOLEY MTR SLVR 16FR STAT (SET/KITS/TRAYS/PACK) ×2 IMPLANT
WATER STERILE IRR 500ML POUR (IV SOLUTION) ×2 IMPLANT
YANKAUER SUCT BULB TIP NO VENT (SUCTIONS) ×2 IMPLANT

## 2022-02-07 NOTE — Assessment & Plan Note (Signed)
Continue fall precaution and neurochecks Consider PT/OT eval and treat status post surgical repair of right hip

## 2022-02-07 NOTE — Anesthesia Postprocedure Evaluation (Signed)
Anesthesia Post Note  Patient: SHAREENA NUSZ  Procedure(s) Performed: INTRAMEDULLARY (IM) NAIL INTERTROCHANTRIC (Right: Hip)  Patient location during evaluation: PACU Anesthesia Type: General Level of consciousness: awake and alert and oriented Pain management: pain level controlled Vital Signs Assessment: post-procedure vital signs reviewed and stable Respiratory status: spontaneous breathing, nonlabored ventilation, respiratory function stable and patient connected to nasal cannula oxygen Cardiovascular status: blood pressure returned to baseline and stable Postop Assessment: no apparent nausea or vomiting Anesthetic complications: no   No notable events documented.   Last Vitals:  Vitals:   02/07/22 1814 02/07/22 1815  BP: (!) 123/95 (!) 123/95  Pulse: 93 89  Resp: 18 11  Temp: 36.6 C   SpO2: 93% 100%    Last Pain:  Vitals:   02/07/22 1814  TempSrc:   PainSc: Asleep                 Kharizma Lesnick C Topanga Alvelo

## 2022-02-07 NOTE — Progress Notes (Signed)
Patient vomited x 2 . Informed consent done and placed in patients chart.

## 2022-02-07 NOTE — Progress Notes (Signed)
Patient seen and evaluated, chart reviewed, please see EMR for updated orders. Please see full H&P dictated by admitting physician Dr. Thomes Dinning for same date of service.   Brief Summary:- 51 y.o. female with medical history significant of asthma, stroke, tobacco use, alcohol use admitted on 02/07/2022 with right femoral neck fracture after mechanical fall   A/p 1)Right hip x-ray showed displaced intertrochanteric fracture of the right femoral neck-----status post Cephalomedullary nail for Right intertrochanteric femur fracture -Post-operative plan:  Weightbearing: The patient will be WBAT on the operative extremity.   DVT prophylaxis- Recommend 81 mg Aspirin BID, start Ppx POD#1 Dressing can be reinforced as needed, will change on POD#2/3 if needed.  Patient does not need to remain hospitalized for dressing change Follow up plan: approximately 2 weeks postop for incision check and XR.  If the patient will be returning to a nursing facility, staples can be removed around this time and a follow up appointment can be scheduled for 6 weeks after surgery. XR at next visit:  please obtain AP pelvis, and 2 views of the Right femur  2) alcohol use--elevated MCV noted -Use lorazepam per CIWA protocol along with folic acid and thiamine  3)GERD--continue Protonix  4) tobacco abuse/COPD--nicotine patch as ordered -Use as needed bronchodilators  5)HLD--hold atorvastatin  -Total care time over 46 minutes  Shon Hale, MD

## 2022-02-07 NOTE — Assessment & Plan Note (Signed)
MCV 100.5; vitamin B12 and folate levels will be checked

## 2022-02-07 NOTE — Anesthesia Preprocedure Evaluation (Signed)
Anesthesia Evaluation  Patient identified by MRN, date of birth, ID band Patient awake    Reviewed: Allergy & Precautions, NPO status , Patient's Chart, lab work & pertinent test results  Airway Mallampati: III  TM Distance: >3 FB Neck ROM: Full    Dental  (+) Dental Advisory Given, Caps   Pulmonary asthma , COPD, Current Smoker and Patient abstained from smoking.,    Pulmonary exam normal breath sounds clear to auscultation       Cardiovascular Exercise Tolerance: Good Normal cardiovascular exam+ Valvular Problems/Murmurs  Rhythm:Regular Rate:Normal     Neuro/Psych  Headaches, TIACVA    GI/Hepatic Neg liver ROS, GERD  Medicated and Controlled,  Endo/Other  negative endocrine ROS  Renal/GU negative Renal ROS     Musculoskeletal  (+) Arthritis , Osteoarthritis,    Abdominal   Peds  Hematology negative hematology ROS (+)   Anesthesia Other Findings   Reproductive/Obstetrics                             Anesthesia Physical Anesthesia Plan  ASA: 3  Anesthesia Plan: General   Post-op Pain Management: Dilaudid IV   Induction: Intravenous  PONV Risk Score and Plan: 3 and Ondansetron, Dexamethasone and Midazolam  Airway Management Planned: Oral ETT  Additional Equipment:   Intra-op Plan:   Post-operative Plan: Extubation in OR  Informed Consent: I have reviewed the patients History and Physical, chart, labs and discussed the procedure including the risks, benefits and alternatives for the proposed anesthesia with the patient or authorized representative who has indicated his/her understanding and acceptance.     Dental advisory given  Plan Discussed with: Surgeon and CRNA  Anesthesia Plan Comments:         Anesthesia Quick Evaluation

## 2022-02-07 NOTE — Assessment & Plan Note (Signed)
Right hip x-ray showed displaced intertrochanteric fracture of the right femoral neck Continue IV Dilaudid as needed Patient will be kept n.p.o. in anticipation for possible surgical intervention in the morning Continue fall precaution and neurochecks Orthopedic surgery was already consulted and will see patient in the morning per ED physician

## 2022-02-07 NOTE — Assessment & Plan Note (Signed)
CBG 122; this is possibly reactive Continue to monitor WBC with morning labs

## 2022-02-07 NOTE — Anesthesia Procedure Notes (Signed)
Procedure Name: Intubation Date/Time: 02/07/2022 4:36 PM Performed by: Jonna Munro, CRNA Pre-anesthesia Checklist: Patient identified, Emergency Drugs available, Suction available, Patient being monitored and Timeout performed Patient Re-evaluated:Patient Re-evaluated prior to induction Oxygen Delivery Method: Circle system utilized Preoxygenation: Pre-oxygenation with 100% oxygen Induction Type: IV induction Laryngoscope Size: Mac and 3 Grade View: Grade I Tube type: Oral Number of attempts: 1 Airway Equipment and Method: Stylet Secured at: 22 cm Tube secured with: Tape Dental Injury: Teeth and Oropharynx as per pre-operative assessment

## 2022-02-07 NOTE — ED Triage Notes (Signed)
Patient arrived from home via EMS after falling. Patient was standing bed and fell on the right hip. ETOH on board. Patient was given of fentanyl in route. Patient takes ASA daily.

## 2022-02-07 NOTE — Op Note (Signed)
Orthopaedic Surgery Operative Note (CSN: [id number])  Tiffany Barry  24-Jul-1971 Date of Surgery: 02/07/2022   Diagnoses:  Right intertrochanteric femur fracture  Procedure: Cephalomedullary nail for Right intertrochanteric femur fracture   Operative Finding Successful completion of the planned procedure.  Placement of 125 degree, 10 mm x 330 nail.  No complications.    Post-Op Diagnosis: Same Surgeons:Primary: Oliver Barre, MD Assistants: None Location: [facility] OR ROOM 3 Anesthesia: General with local anesthesia Antibiotics: Ancef 2 g Tourniquet time: N/A Estimated Blood Loss:  200 cc Complications: None Specimens: None Implants: Implant Name Type Inv. Item Serial No. Manufacturer Lot No. LRB No. Used Action  SCREW LOCK LAG 10.5X95 GALILEO - SSTERILE ON SET Screw SCREW LOCK LAG 10.5X95 GALILEO STERILE ON SET [employer]  Right 1 Implanted  SCREW LOCK CORT 5X34 - SSTERILE ON SET  Screw SCREW LOCK CORT 5X34 STERILE ON SET  [employer]  Right 1 Implanted  NAIL ES TROCH RT [id number] - SSTERILE ON SET  Nail NAIL ES TROCH RT [id number] STERILE ON SET  [employer]  Right 1 Implanted    Indications for Surgery:   Tiffany Barry is a 51 y.o. female who had a mechanical fall and sustained a Right intertrochanteric femur fracture.  I recommended operative fixation to restore stability and allow the patient to ambulate immediately postop.  Benefits and risks of operative and nonoperative management were discussed prior to surgery with the patient and informed consent form was completed.  Specific risks including infection, need for additional surgery, persistent pain, bleeding, malunion, nonunion and more severe complications associated with anesthesia.  The patient/guardian elected to proceed and surgical consent was obtained.    Procedure:   The patient was identified properly. Informed consent was obtained and the surgical site was marked. The patient was taken to the OR where general anesthesia  was induced.   The patient was placed supine on a fracture table and appropriate reduction was obtained and visualized on fluoroscopy prior to the beginning of the procedure.  Timeout was performed before the beginning of the case.  Ancef 2 g dosing was confirmed prior to making incision.  The patient received TXA prior to the start of surgery.   We made an incision proximal to the greater trochanter and dissected down through the fascia.  We then carefully placed a guidepin, localizing under fluoroscopy.  Once satisfied with the starting point, the entry reamer was used to gain entry into the intramedullary canal.  A ball tip guidewire was then introduced and passed to an appropriate level at the physeal scar of the distal femur and measurement was obtained proximally using fluoroscopy.  We selected the appropriate length of nail, as noted above.  Entry reamer was used.  We then reamed started with an 8 mm reamer, and increased to an 11.5 mm reamer.  We achieved excellent chatter.  At this point we placed our nail localizing under fluoroscopy, and confirmed that it was at the appropriate level.  Next we used the outrigger device to pass a wire into the femoral neck, and then the cephalomedullary lag screw.  The screw was locked proximally to avoid over collapse.  We then turned our attention to the distal interlocking screw.  Once again, we used the outrigger device to place a single interlocking screw in the midshaft area.  The outrigger device was removed and final fluoroscopic images were obtained.  The wounds were thoroughly irrigated closed in a multilayer fashion with 0 vicryl, 2-0  monocryl and staples.  Sterile dressings were placed.  The patient was awoken from general anesthesia and taken to the PACU in stable condition without complication.     Post-operative plan:  Weightbearing: The patient will be WBAT on the operative extremity.   DVT prophylaxis per primary team, no orthopedic  contraindications.  Recommend 81 mg Aspirin BID, unless patient cannot tolerate or was previously on anticoagulation.  Prefer to start Ppx POD#1 Pain control with PRN pain medication preferring oral medicines.   Dressing can be reinforced as needed, will change on POD#2/3 if needed.  Patient does not need to remain hospitalized for dressing change Follow up plan: approximately 2 weeks postop for incision check and XR.  If the patient will be returning to a nursing facility, staples can be removed around this time and a follow up appointment can be scheduled for 6 weeks after surgery. XR at next visit:  please obtain [facility] pelvis, and 2 views of the Right femur

## 2022-02-07 NOTE — TOC Progression Note (Signed)
°  Transition of Care [facility]) Screening Note   Patient Details  Name: Tiffany Barry Date of Birth: [birth year]   Transition of Care [facility]) CM/SW Contact:    Elliot Gault, LCSW Phone Number: 02/07/2022, 1:35 PM    Pt admitted from home with Hip Fx. Received TOC consult for medication assistance. Pt has [place] Medicaid with max $4 co-pay for medications. There are no other assistance programs that she is eligible for.   Pt having surgery today. TOC available to consult for any dc planning needs once therapy eval complete.   Transition of Care Department [facility]) has reviewed patient and no TOC needs have been identified at this time. We will continue to monitor patient advancement through interdisciplinary progression rounds. If new patient transition needs arise, please place a TOC consult.

## 2022-02-07 NOTE — Plan of Care (Signed)
°  Problem: Pain Management: Goal: Pain level will decrease Outcome: Not Progressing   Problem: Activity: Goal: Ability to ambulate and perform ADLs will improve Outcome: Not Progressing   Problem: Education: Goal: Verbalization of understanding the information provided (i.e., activity precautions, restrictions, etc) will improve Outcome: Not Progressing Goal: Individualized Educational Video(s) Outcome: Not Progressing

## 2022-02-07 NOTE — Transfer of Care (Signed)
Immediate Anesthesia Transfer of Care Note  Patient: Tiffany Barry  Procedure(s) Performed: INTRAMEDULLARY (IM) NAIL INTERTROCHANTRIC (Right: Hip)  Patient Location: PACU  Anesthesia Type:General  Level of Consciousness: awake, sedated, drowsy and patient cooperative  Airway & Oxygen Therapy: Patient Spontanous Breathing and Patient connected to nasal cannula oxygen  Post-op Assessment: Report given to RN and Post -op Vital signs reviewed and stable  Post vital signs: Reviewed and stable  Last Vitals:  Vitals Value Taken Time  BP 131/70 02/07/22 1813  Temp 97.8   Pulse 89 02/07/22 1814  Resp 11 02/07/22 1814  SpO2 100 % 02/07/22 1814  Vitals shown include unvalidated device data.  Last Pain:  Vitals:   02/07/22 1814  TempSrc:   PainSc: Asleep      Patients Stated Pain Goal: 5 (02/07/22 1814)  Complications: No notable events documented.

## 2022-02-07 NOTE — Assessment & Plan Note (Signed)
-  Continue Lipitor °

## 2022-02-07 NOTE — Assessment & Plan Note (Signed)
Continue PPI ?
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
## 2022-02-07 NOTE — Assessment & Plan Note (Signed)
Patient was counseled on tobacco abuse cessation ?

## 2022-02-08 DIAGNOSIS — S72001A Fracture of unspecified part of neck of right femur, initial encounter for closed fracture: Secondary | ICD-10-CM | POA: Diagnosis not present

## 2022-02-08 LAB — CBC
HCT: 36.1 % (ref 36.0–46.0)
Hemoglobin: 11.5 g/dL — ABNORMAL LOW (ref 12.0–15.0)
MCH: 32.5 pg (ref 26.0–34.0)
MCHC: 31.9 g/dL (ref 30.0–36.0)
MCV: 102 fL — ABNORMAL HIGH (ref 80.0–100.0)
Platelets: 226 10*3/uL (ref 150–400)
RBC: 3.54 MIL/uL — ABNORMAL LOW (ref 3.87–5.11)
RDW: 12.3 % (ref 11.5–15.5)
WBC: 8.8 10*3/uL (ref 4.0–10.5)
nRBC: 0 % (ref 0.0–0.2)

## 2022-02-08 LAB — COMPREHENSIVE METABOLIC PANEL
ALT: 23 U/L (ref 0–44)
AST: 37 U/L (ref 15–41)
Albumin: 3.3 g/dL — ABNORMAL LOW (ref 3.5–5.0)
Alkaline Phosphatase: 114 U/L (ref 38–126)
Anion gap: 7 (ref 5–15)
BUN: 6 mg/dL (ref 6–20)
CO2: 27 mmol/L (ref 22–32)
Calcium: 8.7 mg/dL — ABNORMAL LOW (ref 8.9–10.3)
Chloride: 101 mmol/L (ref 98–111)
Creatinine, Ser: 0.69 mg/dL (ref 0.44–1.00)
GFR, Estimated: >60 mL/min (ref >60)
Glucose, Bld: 186 mg/dL — ABNORMAL HIGH (ref 70–99)
Potassium: 5.3 mmol/L — ABNORMAL HIGH (ref 3.5–5.1)
Sodium: 135 mmol/L (ref 135–145)
Total Bilirubin: 0.7 mg/dL (ref 0.3–1.2)
Total Protein: 6.4 g/dL — ABNORMAL LOW (ref 6.5–8.1)

## 2022-02-08 MED ORDER — SENNOSIDES-DOCUSATE SODIUM 8.6-50 MG PO TABS
2.0000 | ORAL_TABLET | Freq: Two times a day (BID) | ORAL | 0 refills | Status: DC
Start: 2022-02-08 — End: 2022-02-21

## 2022-02-08 MED ORDER — DIAZEPAM 5 MG PO TABS
5.0000 mg | ORAL_TABLET | Freq: Once | ORAL | Status: DC
  Filled 2022-02-08: qty 1

## 2022-02-08 MED ORDER — CEFAZOLIN SODIUM-DEXTROSE 2-4 GM/100ML-% IV SOLN
2.0000 g | Freq: Three times a day (TID) | INTRAVENOUS | Status: DC
  Administered 2022-02-08: 2 g via INTRAVENOUS
  Filled 2022-02-08: qty 100

## 2022-02-08 MED ORDER — METHOCARBAMOL 750 MG PO TABS: 750.0000 mg | ORAL_TABLET | Freq: Three times a day (TID) | ORAL | 0 refills | Status: DC

## 2022-02-08 MED ORDER — ATORVASTATIN CALCIUM 40 MG PO TABS: 40.0000 mg | ORAL_TABLET | Freq: Every day | ORAL | 1 refills | Status: AC

## 2022-02-08 MED ORDER — ADULT MULTIVITAMIN W/MINERALS CH: 1.0000 | ORAL_TABLET | Freq: Every day | ORAL | 0 refills | Status: DC

## 2022-02-08 MED ORDER — PANTOPRAZOLE SODIUM 40 MG PO TBEC: 40.0000 mg | DELAYED_RELEASE_TABLET | Freq: Every day | ORAL | 3 refills | Status: AC

## 2022-02-08 MED ORDER — POLYETHYLENE GLYCOL 3350 17 G PO PACK: 17.0000 g | PACK | Freq: Every day | ORAL | 0 refills | Status: DC

## 2022-02-08 MED ORDER — OXYCODONE-ACETAMINOPHEN 7.5-325 MG PO TABS: 1.0000 | ORAL_TABLET | Freq: Four times a day (QID) | ORAL | 0 refills | Status: DC | PRN

## 2022-02-08 MED ORDER — THIAMINE HCL 100 MG PO TABS: 100.0000 mg | ORAL_TABLET | Freq: Every day | ORAL | 3 refills | Status: AC

## 2022-02-08 MED ORDER — FOLIC ACID 1 MG PO TABS: 1.0000 mg | ORAL_TABLET | Freq: Every day | ORAL | 3 refills | Status: AC

## 2022-02-08 MED ORDER — ASPIRIN 81 MG PO TBEC: 81.0000 mg | DELAYED_RELEASE_TABLET | Freq: Two times a day (BID) | ORAL | 0 refills | Status: AC

## 2022-02-08 MED ORDER — NICOTINE 21 MG/24HR TD PT24: 21.0000 mg | MEDICATED_PATCH | TRANSDERMAL | 0 refills | Status: DC

## 2022-02-08 MED ORDER — ALBUTEROL SULFATE HFA 108 (90 BASE) MCG/ACT IN AERS: 2.0000 | INHALATION_SPRAY | RESPIRATORY_TRACT | 2 refills | Status: AC | PRN

## 2022-02-08 NOTE — Progress Notes (Signed)
Patient observed walking around room by housekeeping, and they said she was attempting to pull her IV out. I educated patient that she needs to call for assistance as she is one day post op and should not be transferring without a 1 person assist. Patient states " I have done this before , I just want to get the heck out of here". Offered positive reassurance that PT needs to evaluate patient and make recommendations based on how she will transfer safely at home. Educated to use call light for assistance. Dr. Marisa Severin was made aware and PT should be to see patient as soon as possible .

## 2022-02-08 NOTE — Evaluation (Signed)
Physical Therapy Evaluation Patient Details Name: Tiffany Barry MRN: [id number] DOB: 1971/02/25 Today's Date: 02/08/2022  History of Present Illness  Tiffany Barry is a 51 y.o. female with medical history significant of asthma, stroke, tobacco use, alcohol use (she states that she drinks alcohol in the evening prior to bed) who presents to the emergency department after sustaining a fall at home.  Patient states that she stood on her water bed to adjust the setting fine and unfortunately fell to the floor on her right side.  She states that she was in so much pain that her friend who lives with her had to call EMS.  She complained of a lot of pain in the right hip, but denies chest pain, shortness of breath, head trauma, neck or back pain.  Clinical Impression  Patient presents supine in bed. She is awake, alert and cooperative, though somewhat restless noting that she is "leaving here today regardless" of DC planning. Patient is able to perform bed mobility Mod I using UE assist, and able to sit to stand with use of hands, but is slightly labored due to pain. Patient reports no pain at rest. Patient shows fair static standing balance, but requires RW support for dynamic balance and gait. Patient able to ambulate 100 feet in hallway using RW with no LOB, though demos slow cadence with decreased WB through RLE. Patient returned to room and returned to bed. She did require Min A for RLE assist into bed. Patient left in bed with phone and call bell in reach, bed alarm set. Patient will benefit from continued physical therapy in hospital and recommended venue below to increase strength, balance, endurance for safe ADLs and gait.         Recommendations for follow up therapy are one component of a multi-disciplinary discharge planning process, led by the attending physician.  Recommendations may be updated based on patient status, additional functional criteria and insurance authorization.  Follow Up  Recommendations Outpatient PT    Assistance Recommended at Discharge Intermittent Supervision/Assistance  Patient can return home with the following  A little help with walking and/or transfers;Assistance with cooking/housework;Help with stairs or ramp for entrance    Equipment Recommendations Rolling walker (2 wheels)  Recommendations for Other Services       Functional Status Assessment Patient has had a recent decline in their functional status and demonstrates the ability to make significant improvements in function in a reasonable and predictable amount of time.     Precautions / Restrictions Precautions Precautions: Fall Restrictions Weight Bearing Restrictions: Yes RLE Weight Bearing: Weight bearing as tolerated      Mobility  Bed Mobility Overal bed mobility: Needs Assistance Bed Mobility: Sit to Supine       Sit to supine: Min assist   General bed mobility comments: Mod I for all except requires Min A for RLE placement sit to supine    Transfers Overall transfer level: Modified independent Equipment used: Rolling walker (2 wheels)               General transfer comment: somewhat labored due to pain, but able to do with RW    Ambulation/Gait Ambulation/Gait assistance: Supervision, Modified independent (Device/Increase time) Gait Distance (Feet): 100 Feet Assistive device: Rolling walker (2 wheels) Gait Pattern/deviations: Decreased stride length, Step-to pattern, Antalgic, Decreased stance time - right       General Gait Details: small step gait, antalgic, decreased WB through RLE, heavy use of UEs  Stairs  Wheelchair Mobility    Modified Rankin (Stroke Patients Only)       Balance Overall balance assessment: Needs assistance Sitting-balance support: Feet supported, Bilateral upper extremity supported Sitting balance-Leahy Scale: Fair Sitting balance - Comments: seated at EOB   Standing balance support: Bilateral upper  extremity supported, During functional activity Standing balance-Leahy Scale: Fair Standing balance comment: can stand unsupported but requires RW for gait and challenge to BOS                             Pertinent Vitals/Pain Pain Assessment Pain Assessment: No/denies pain (No pain "just sore")    Home Living Family/patient expects to be discharged to:: Private residence Living Arrangements: Non-relatives/Friends Available Help at Discharge: Friend(s) Type of Home: House Home Access: Stairs to enter Entrance Stairs-Rails: Can reach both Entrance Stairs-Number of Steps: 2   Home Layout: One level Home Equipment: Cane - quad Additional Comments: Reports lives with female friend, also notes her son lives nearby    Prior Function Prior Level of Function : Independent/Modified Independent                     Higher education careers adviser        Extremity/Trunk Assessment   Upper Extremity Assessment Upper Extremity Assessment: Overall WFL for tasks assessed    Lower Extremity Assessment Lower Extremity Assessment: Generalized weakness       Communication   Communication: No difficulties  Cognition Arousal/Alertness: Awake/alert Behavior During Therapy: WFL for tasks assessed/performed, Restless Overall Cognitive Status: Within Functional Limits for tasks assessed                                          General Comments      Exercises     Assessment/Plan    PT Assessment Patient needs continued PT services  PT Problem List Decreased strength;Decreased mobility;Decreased activity tolerance;Decreased balance       PT Treatment Interventions DME instruction;Gait training;Balance training;Manual techniques;Therapeutic exercise;Stair training;Functional mobility training;Therapeutic activities;Patient/family education;Neuromuscular re-education;Modalities    PT Goals (Current goals can be found in the Care Plan section)  Acute Rehab PT  Goals Patient Stated Goal: Return home PT Goal Formulation: With patient Time For Goal Achievement: 02/22/22 Potential to Achieve Goals: Good    Frequency Min 3X/week     Co-evaluation               AM-PAC PT "6 Clicks" Mobility  Outcome Measure Help needed turning from your back to your side while in a flat bed without using bedrails?: None Help needed moving from lying on your back to sitting on the side of a flat bed without using bedrails?: None Help needed moving to and from a bed to a chair (including a wheelchair)?: None Help needed standing up from a chair using your arms (e.g., wheelchair or bedside chair)?: None Help needed to walk in hospital room?: A Little Help needed climbing 3-5 steps with a railing? : A Little 6 Click Score: 22    End of Session Equipment Utilized During Treatment: Gait belt Activity Tolerance: Patient tolerated treatment well Patient left: in bed;with chair alarm set;with call bell/phone within reach;with bed alarm set Nurse Communication: Mobility status PT Visit Diagnosis: Unsteadiness on feet (R26.81);Other abnormalities of gait and mobility (R26.89);Difficulty in walking, not elsewhere classified (R26.2)    Time: 4854-6270  PT Time Calculation (min) (ACUTE ONLY): 25 min   Charges:   PT Evaluation $PT Eval Low Complexity: 1 Low PT Treatments $Gait Training: 8-22 mins      12:36 PM, 02/08/22 Georges Lynch PT DPT  Physical Therapist with [facility]  [phone]
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
## 2022-02-08 NOTE — Progress Notes (Signed)
Patients friend Juliette Alcide called she is not on patient information list. Patient verbalized that it was ok to give friend information , Juliette Alcide states that patient roommate had patients vehicle and was in [place] with it and that she would be here to get patient but it would be a few hours.

## 2022-02-08 NOTE — Plan of Care (Signed)
°  Problem: Acute Rehab PT Goals(only PT should resolve) Goal: Pt Will Ambulate Flowsheets (Taken 02/08/2022 1238) Pt will Ambulate:  > 125 feet  with modified independence  with rolling walker Goal: Pt Will Go Up/Down Stairs Flowsheets (Taken 02/08/2022 1238) Pt will Go Up / Down Stairs:  1-2 stairs  with modified independence  with rail(s)  12:38 PM, 02/08/22 Georges Lynch PT DPT  Physical Therapist with [facility]  [phone]

## 2022-02-08 NOTE — Progress Notes (Signed)
° °  ORTHOPAEDIC PROGRESS NOTE  S/p Right Hip Cephalomedullary nail  DOS: 02/07/22  SUBJECTIVE: NO issues over night. Pain is controlled.  She is wanting to go home.  She states that she has already gotten out of bed.  The bed is not comfortable and she does not like her breakfast.   OBJECTIVE: PE:  Alert and oriented.  No acute distress.  She is moving her right leg.  Sensation intact to the dorsum of her foot. 2+ DP pulse Dressings are clean, dry and intact  Vitals:   02/07/22 2031 02/08/22 0521  BP: 120/71 120/76  Pulse: 80 63  Resp:  20  Temp:  98.4 F (36.9 C)  SpO2:  98%   CBC Latest Ref Rng & Units 02/08/2022 02/07/2022 11/10/2021  WBC 4.0 - 10.5 K/uL 8.8 6.2 7.5  Hemoglobin 12.0 - 15.0 g/dL 11.5(L) 12.4 15.6(H)  Hematocrit 36.0 - 46.0 % 36.1 37.8 46.2(H)  Platelets 150 - 400 K/uL 226 241 285     ASSESSMENT: Tiffany Barry is a 51 y.o. female doing well. Stable on POD#1.   She wants to go home.  She has not worked with PT yet  PLAN: Weightbearing: WBAT RLE Insicional and dressing care: Reinforce dressings as needed Orthopedic device(s): None VTE prophylaxis: Recommend Aspirin 81mg  BID; to begin POD#1 Pain control: PO pain medications as needed; judicious use of narcotics Follow - up plan: 2 weeks for staple removal    Contact information:     Saba Gomm A. Amedeo Kinsman, MD [place] [address] [place],  [place]  [address] Phone: [phone] Fax: [phone]

## 2022-02-08 NOTE — Progress Notes (Signed)
Pt is requesting to have foley pulled. she is post op. Sent message to on call to please advise. No new orders at this time will continue to monitor. Pt is lying in bed wit eyes open and can verbally make needs known.call bell in reach and bed alarm on

## 2022-02-08 NOTE — Discharge Instructions (Signed)
1) abstinence from alcohol advised to avoid further falls 2) outpatient physical therapy 2-3 times a week advised 3) smoking cessation advised--Okay to use nicotine patch to help you quit smoking 4)Weightbearing: The patient will be WBAT on the operative extremity.   DVT prophylaxis- Recommend 81 mg Aspirin twice daily with food for 1 month Dressing can be reinforced as needed, change on POD#2/3 if needed.  Follow up plan: approximately 2 weeks postop with orthopedic surgeon Dr. Dallas Schimke for incision check and XRay.   -XR at next visit:  please obtain AP pelvis, and 2 views of the Right femur

## 2022-02-12 ENCOUNTER — Encounter (HOSPITAL_COMMUNITY): Payer: Self-pay | Admitting: Orthopedic Surgery

## 2022-02-13 ENCOUNTER — Telehealth: Payer: Self-pay | Admitting: Orthopedic Surgery

## 2022-02-13 NOTE — Telephone Encounter (Signed)
Patient called and states she is in pain and she wants to know what is she suppose to be doing.  She was sent home with no instructions on what to do. ? ?She wants someone to call her and advise her.  She doesn't see anything in her MYCHART.  She doesn't know how long she will be out of work.  She said she didn't get nothing and she is in a lot of pain. ? ?Please call her back and advise her on what she is to do and what to take for her pain, she has her pain medicine put she is still in a lot of pain.  ? ? ?

## 2022-02-13 NOTE — Telephone Encounter (Signed)
Spoke with pt and went over discharge instructions, pt states her pain/muscle tightness is still severe. Let her know that her pain is understandable due to fracture. Work note submitted and sent in [place].  ?

## 2022-02-13 NOTE — Telephone Encounter (Signed)
Spoke with pt who c/o muscle tightness and sharp pain from the hip down when she takes a step.I let pt know that her discharge says she can WBAT, but would like to know why is her therapy starting so late because she's really stiff. Also, states she didn't get a work note (works at [employer]) and needs something to start her disability. Please advise. ?

## 2022-02-21 ENCOUNTER — Other Ambulatory Visit: Payer: Self-pay

## 2022-02-21 ENCOUNTER — Ambulatory Visit (INDEPENDENT_AMBULATORY_CARE_PROVIDER_SITE_OTHER): Payer: Medicaid Other | Admitting: Orthopedic Surgery

## 2022-02-21 ENCOUNTER — Encounter: Payer: Self-pay | Admitting: Orthopedic Surgery

## 2022-02-21 ENCOUNTER — Ambulatory Visit: Payer: Medicaid Other

## 2022-02-21 VITALS — BP 134/82 | HR 98 | Ht 62.0 in | Wt 112.0 lb

## 2022-02-21 DIAGNOSIS — S72001A Fracture of unspecified part of neck of right femur, initial encounter for closed fracture: Secondary | ICD-10-CM

## 2022-02-21 DIAGNOSIS — S72001D Fracture of unspecified part of neck of right femur, subsequent encounter for closed fracture with routine healing: Secondary | ICD-10-CM

## 2022-02-21 MED ORDER — CYCLOBENZAPRINE HCL 10 MG PO TABS: 10.0000 mg | ORAL_TABLET | Freq: Two times a day (BID) | ORAL | 0 refills | Status: DC | PRN

## 2022-02-21 MED ORDER — OXYCODONE HCL 5 MG PO TABS: 5.0000 mg | ORAL_TABLET | ORAL | 0 refills | Status: AC | PRN

## 2022-02-21 NOTE — Progress Notes (Signed)
Orthopaedic Postop Note ? ?Assessment: ?Tiffany Barry is a 51 y.o. female s/p cepahlomedullary nail for a right intertrochanteric femur fracture ? ?DOS: 02/07/22 ? ?Plan: ?Sutures trimmed, steri strips placed ?Radiographs stable ?She is doing well at this point. ?She is at home with [facility] PT, to begin 3/16 ?Refills on medications ?Follow up in 4 weeks ? ?Meds ordered this encounter  ?Medications  ? cyclobenzaprine (FLEXERIL) 10 MG tablet  ?  Sig: Take 1 tablet (10 mg total) by mouth 2 (two) times daily as needed for muscle spasms.  ?  Dispense:  20 tablet  ?  Refill:  0  ? oxyCODONE (ROXICODONE) 5 MG immediate release tablet  ?  Sig: Take 1 tablet (5 mg total) by mouth every 4 (four) hours as needed for up to 7 days.  ?  Dispense:  30 tablet  ?  Refill:  0  ? ? ? ?Follow-up: ?Return in about 4 weeks (around 03/21/2022). ?XR at next visit: AP pelvis, R femur ? ?Subjective: ? ?Chief Complaint  ?Patient presents with  ? Routine Post Op  ?  Rt Femur DOS 02/07/22  ? ? ?History of Present Illness: ?Tiffany Barry is a 51 y.o. female who presents following the above stated procedure.  Surgery was 2 weeks ago.  She was discharged home.  Home health has not been to her house yet, to initiate formal physical therapy.  She is ambulating with the assistance of crutches.  She continues to have pain in the right groin and hip area.  She states that she needs to get better so she can return to work.  No issues with her incisions.  No problems breathing.  No numbness or tingling. ? ?Review of Systems: ?No fevers or chills ?No numbness or tingling ?No Chest Pain ?No shortness of breath ? ? ?Objective: ?BP 134/82   Pulse 98   Ht 5\' 2"  (1.575 m)   Wt 112 lb (50.8 kg)   LMP 06/20/2011   BMI 20.49 kg/m?  ? ?Physical Exam: ? ?Alert and oriented.  No acute distress. ? ?Ambulates with crutches. ? ?Lateral hip incisions are healing well.  No surrounding erythema or drainage.  Mild tenderness to palpation in line with the incisions.  She  tolerates gentle range of motion of her hip.  She can maintain straight leg raise.  Mild discomfort with axial loading.  Toes are warm and well-perfused.  Sensation is intact over the dorsum of her foot. ? ? ?IMAGING: ?I personally ordered and reviewed the following images: ? ?X-rays of the right hip and femur demonstrates a well-positioned cephalomedullary nail.  There is been no interval displacement at the fracture site.  Positioning of the nail remained stable.  No evidence of hardware failure or loosening.  No acute injuries are noted. ? ?Impression: Healing right intertrochanteric femur fracture without evidence of hardware failure or loosening. ? ? ?08/21/2011, MD ?02/21/2022 ?3:47 PM ? ? ?

## 2022-02-27 ENCOUNTER — Ambulatory Visit (HOSPITAL_COMMUNITY): Payer: Medicaid Other | Attending: Family Medicine | Admitting: Physical Therapy

## 2022-02-27 ENCOUNTER — Other Ambulatory Visit: Payer: Self-pay

## 2022-02-27 DIAGNOSIS — M5441 Lumbago with sciatica, right side: Secondary | ICD-10-CM | POA: Insufficient documentation

## 2022-02-27 DIAGNOSIS — S72141A Displaced intertrochanteric fracture of right femur, initial encounter for closed fracture: Secondary | ICD-10-CM | POA: Insufficient documentation

## 2022-02-27 DIAGNOSIS — G8929 Other chronic pain: Secondary | ICD-10-CM | POA: Insufficient documentation

## 2022-02-27 DIAGNOSIS — M6281 Muscle weakness (generalized): Secondary | ICD-10-CM | POA: Diagnosis not present

## 2022-02-27 DIAGNOSIS — R262 Difficulty in walking, not elsewhere classified: Secondary | ICD-10-CM | POA: Insufficient documentation

## 2022-02-27 DIAGNOSIS — M5442 Lumbago with sciatica, left side: Secondary | ICD-10-CM | POA: Diagnosis not present
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
## 2022-03-04 ENCOUNTER — Encounter (HOSPITAL_COMMUNITY): Payer: Self-pay | Admitting: Physical Therapy

## 2022-03-04 ENCOUNTER — Ambulatory Visit (HOSPITAL_COMMUNITY): Payer: Medicaid Other | Admitting: Physical Therapy

## 2022-03-04 ENCOUNTER — Other Ambulatory Visit: Payer: Self-pay

## 2022-03-04 DIAGNOSIS — R262 Difficulty in walking, not elsewhere classified: Secondary | ICD-10-CM | POA: Diagnosis not present

## 2022-03-04 DIAGNOSIS — G8929 Other chronic pain: Secondary | ICD-10-CM | POA: Diagnosis not present

## 2022-03-04 DIAGNOSIS — M6281 Muscle weakness (generalized): Secondary | ICD-10-CM | POA: Diagnosis not present

## 2022-03-04 DIAGNOSIS — S72141A Displaced intertrochanteric fracture of right femur, initial encounter for closed fracture: Secondary | ICD-10-CM | POA: Diagnosis not present

## 2022-03-04 DIAGNOSIS — M5441 Lumbago with sciatica, right side: Secondary | ICD-10-CM | POA: Diagnosis not present

## 2022-03-04 DIAGNOSIS — M5442 Lumbago with sciatica, left side: Secondary | ICD-10-CM | POA: Diagnosis not present
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
## 2022-03-06 ENCOUNTER — Encounter (HOSPITAL_COMMUNITY): Payer: Medicaid Other

## 2022-03-07 ENCOUNTER — Other Ambulatory Visit: Payer: Self-pay

## 2022-03-07 ENCOUNTER — Ambulatory Visit (HOSPITAL_COMMUNITY): Payer: Medicaid Other | Admitting: Physical Therapy

## 2022-03-07 ENCOUNTER — Encounter (HOSPITAL_COMMUNITY): Payer: Self-pay | Admitting: Physical Therapy

## 2022-03-07 DIAGNOSIS — R262 Difficulty in walking, not elsewhere classified: Secondary | ICD-10-CM

## 2022-03-07 DIAGNOSIS — S72141A Displaced intertrochanteric fracture of right femur, initial encounter for closed fracture: Secondary | ICD-10-CM | POA: Diagnosis not present

## 2022-03-07 DIAGNOSIS — M5441 Lumbago with sciatica, right side: Secondary | ICD-10-CM | POA: Diagnosis not present

## 2022-03-07 DIAGNOSIS — G8929 Other chronic pain: Secondary | ICD-10-CM | POA: Diagnosis not present

## 2022-03-07 DIAGNOSIS — M6281 Muscle weakness (generalized): Secondary | ICD-10-CM | POA: Diagnosis not present

## 2022-03-07 DIAGNOSIS — M5442 Lumbago with sciatica, left side: Secondary | ICD-10-CM | POA: Diagnosis not present
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
## 2022-03-11 ENCOUNTER — Encounter (HOSPITAL_COMMUNITY): Payer: Self-pay | Admitting: Physical Therapy

## 2022-03-11 ENCOUNTER — Other Ambulatory Visit: Payer: Self-pay

## 2022-03-11 ENCOUNTER — Ambulatory Visit (HOSPITAL_COMMUNITY): Payer: Medicaid Other | Admitting: Physical Therapy

## 2022-03-11 DIAGNOSIS — M6281 Muscle weakness (generalized): Secondary | ICD-10-CM | POA: Diagnosis not present

## 2022-03-11 DIAGNOSIS — M5441 Lumbago with sciatica, right side: Secondary | ICD-10-CM | POA: Diagnosis not present

## 2022-03-11 DIAGNOSIS — R262 Difficulty in walking, not elsewhere classified: Secondary | ICD-10-CM

## 2022-03-11 DIAGNOSIS — G8929 Other chronic pain: Secondary | ICD-10-CM | POA: Diagnosis not present

## 2022-03-11 DIAGNOSIS — S72141A Displaced intertrochanteric fracture of right femur, initial encounter for closed fracture: Secondary | ICD-10-CM | POA: Diagnosis not present

## 2022-03-11 DIAGNOSIS — M5442 Lumbago with sciatica, left side: Secondary | ICD-10-CM | POA: Diagnosis not present
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
## 2022-03-13 ENCOUNTER — Encounter (HOSPITAL_COMMUNITY): Payer: Self-pay

## 2022-03-13 ENCOUNTER — Ambulatory Visit (HOSPITAL_COMMUNITY): Payer: Medicaid Other

## 2022-03-13 DIAGNOSIS — M5441 Lumbago with sciatica, right side: Secondary | ICD-10-CM | POA: Diagnosis not present

## 2022-03-13 DIAGNOSIS — M6281 Muscle weakness (generalized): Secondary | ICD-10-CM

## 2022-03-13 DIAGNOSIS — S72141A Displaced intertrochanteric fracture of right femur, initial encounter for closed fracture: Secondary | ICD-10-CM | POA: Diagnosis not present

## 2022-03-13 DIAGNOSIS — M5442 Lumbago with sciatica, left side: Secondary | ICD-10-CM | POA: Diagnosis not present

## 2022-03-13 DIAGNOSIS — G8929 Other chronic pain: Secondary | ICD-10-CM

## 2022-03-13 DIAGNOSIS — R262 Difficulty in walking, not elsewhere classified: Secondary | ICD-10-CM | POA: Diagnosis not present
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
## 2022-03-18 ENCOUNTER — Ambulatory Visit (HOSPITAL_COMMUNITY): Payer: Medicaid Other | Attending: Family Medicine

## 2022-03-18 ENCOUNTER — Encounter (HOSPITAL_COMMUNITY): Payer: Self-pay

## 2022-03-18 DIAGNOSIS — G8929 Other chronic pain: Secondary | ICD-10-CM | POA: Diagnosis present

## 2022-03-18 DIAGNOSIS — M5442 Lumbago with sciatica, left side: Secondary | ICD-10-CM | POA: Insufficient documentation

## 2022-03-18 DIAGNOSIS — R262 Difficulty in walking, not elsewhere classified: Secondary | ICD-10-CM | POA: Insufficient documentation

## 2022-03-18 DIAGNOSIS — M6281 Muscle weakness (generalized): Secondary | ICD-10-CM | POA: Insufficient documentation

## 2022-03-18 DIAGNOSIS — M5441 Lumbago with sciatica, right side: Secondary | ICD-10-CM | POA: Diagnosis present
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
## 2022-03-19 ENCOUNTER — Ambulatory Visit: Payer: Medicaid Other

## 2022-03-19 ENCOUNTER — Ambulatory Visit (INDEPENDENT_AMBULATORY_CARE_PROVIDER_SITE_OTHER): Payer: Medicaid Other | Admitting: Orthopedic Surgery

## 2022-03-19 ENCOUNTER — Encounter: Payer: Self-pay | Admitting: Orthopedic Surgery

## 2022-03-19 VITALS — Ht 62.0 in | Wt 112.0 lb

## 2022-03-19 DIAGNOSIS — S72001A Fracture of unspecified part of neck of right femur, initial encounter for closed fracture: Secondary | ICD-10-CM

## 2022-03-19 DIAGNOSIS — S72041D Displaced fracture of base of neck of right femur, subsequent encounter for closed fracture with routine healing: Secondary | ICD-10-CM | POA: Diagnosis not present

## 2022-03-19 MED ORDER — CYCLOBENZAPRINE HCL 10 MG PO TABS: 10.0000 mg | ORAL_TABLET | Freq: Two times a day (BID) | ORAL | 0 refills | Status: DC | PRN

## 2022-03-19 NOTE — Patient Instructions (Signed)

## 2022-03-19 NOTE — Progress Notes (Signed)
Orthopaedic Postop Note ? ?Assessment: ?Tiffany Barry is a 51 y.o. female s/p cepahlomedullary nail for a right intertrochanteric femur fracture ? ?DOS: 02/07/22 ? ?Plan: ?Tiffany Barry is progressing slowly.  She continues to have some pain in the right hip, as well as the right knee.  Radiographs are stable.  Clinically, I cannot appreciate a leg length discrepancy.  The only way to truly evaluate this is via full-length x-rays, which we can potentially assess after further recovery.  Continue to work with therapy, and walking is excellent activity.  She has for refill on Flexeril, and this was provided today.  Provided reassurance, and urged her to continue working hard, as this can be a prolonged recovery.  We also discussed that as a smoker, she is at increased risk for issues with bony healing.  All questions were answered, and she is amenable this plan.  Follow-up 6 weeks. ? ?Meds ordered this encounter  ?Medications  ? cyclobenzaprine (FLEXERIL) 10 MG tablet  ?  Sig: Take 1 tablet (10 mg total) by mouth 2 (two) times daily as needed for muscle spasms.  ?  Dispense:  20 tablet  ?  Refill:  0  ? ? ? ?Follow-up: ?Return in about 6 weeks (around 04/30/2022). ?XR at next visit: AP pelvis, R femur ? ?Subjective: ? ?Chief Complaint  ?Patient presents with  ? Routine Post Op  ?  Rt hip DOS 02/07/22  ? Knee Pain  ?  Rt knee pain since injury.   ? ? ?History of Present Illness: ?Tiffany Barry is a 52 y.o. female who presents following the above stated procedure.  Surgery was 6 weeks ago.  She has been working with physical therapy.  She continues to have pain in her right groin, as well as the right knee.  She is currently ambulating with the assistance of a cane.  She states that PT told her she had a 1.25 inch leg length discrepancy, with the right leg shorter than the left.  Some tenderness with the surgical incisions.  Otherwise, no drainage or other issues with her incisions in the lateral hip. ? ?Review of  Systems: ?No fevers or chills ?No numbness or tingling ?No Chest Pain ?No shortness of breath ? ? ?Objective: ?Ht 5\' 2"  (1.575 m)   Wt 112 lb (50.8 kg)   LMP 06/20/2011   BMI 20.49 kg/m?  ? ?Physical Exam: ? ?Alert and oriented.  No acute distress. ? ?Ambulates with a cane.. ? ?Lateral hip incisions are healing well.  No surrounding erythema or drainage.  Some tenderness to palpation directly over the incisions.  She is able to maintain a straight leg raise without pain.  She tolerates gentle range of motion of her right hip, with some pain in the groin.  Clinically, I cannot appreciate a leg length discrepancy as her knees are roughly equal, with no appreciable pelvic tilt.  She is able to achieve full extension of the right knee.  Toes are warm and well-perfused. ? ? ?IMAGING: ?I personally ordered and reviewed the following images: ? ?AP pelvis and right femur x-rays were obtained in clinic today.  These were compared to prior x-rays.  There is been no migration of the lag screw within the femoral head.  Evidence of some callus formation about the fracture.  Overall alignment remains unchanged from prior x-ray. ? ?Impression: Stable right basicervical proximal femur fracture, without hardware failure or subsidence of the fracture. ? ?Mordecai Rasmussen, MD ?03/19/2022 ?10:41 AM ? ? ?

## 2022-03-20 ENCOUNTER — Ambulatory Visit (HOSPITAL_COMMUNITY): Payer: Medicaid Other

## 2022-03-20 DIAGNOSIS — G8929 Other chronic pain: Secondary | ICD-10-CM

## 2022-03-20 DIAGNOSIS — R262 Difficulty in walking, not elsewhere classified: Secondary | ICD-10-CM | POA: Diagnosis not present

## 2022-03-20 DIAGNOSIS — M6281 Muscle weakness (generalized): Secondary | ICD-10-CM
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
## 2022-03-24 ENCOUNTER — Ambulatory Visit (HOSPITAL_COMMUNITY): Payer: Medicaid Other | Admitting: Physical Therapy

## 2022-03-24 DIAGNOSIS — G8929 Other chronic pain: Secondary | ICD-10-CM

## 2022-03-24 DIAGNOSIS — M6281 Muscle weakness (generalized): Secondary | ICD-10-CM

## 2022-03-24 DIAGNOSIS — R262 Difficulty in walking, not elsewhere classified: Secondary | ICD-10-CM
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
## 2022-03-27 ENCOUNTER — Telehealth (HOSPITAL_COMMUNITY): Payer: Self-pay

## 2022-03-27 ENCOUNTER — Encounter (HOSPITAL_COMMUNITY): Payer: Medicaid Other

## 2022-03-31 ENCOUNTER — Ambulatory Visit (HOSPITAL_COMMUNITY): Payer: Medicaid Other | Admitting: Physical Therapy

## 2022-03-31 DIAGNOSIS — R262 Difficulty in walking, not elsewhere classified: Secondary | ICD-10-CM

## 2022-03-31 DIAGNOSIS — M6281 Muscle weakness (generalized): Secondary | ICD-10-CM

## 2022-03-31 DIAGNOSIS — G8929 Other chronic pain: Secondary | ICD-10-CM
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
## 2022-04-03 ENCOUNTER — Telehealth (HOSPITAL_COMMUNITY): Payer: Self-pay | Admitting: Physical Therapy

## 2022-04-03 ENCOUNTER — Encounter (HOSPITAL_COMMUNITY): Payer: Medicaid Other | Admitting: Physical Therapy

## 2022-04-03 NOTE — Telephone Encounter (Signed)
Pt did not show for appointment.  Called and spoke to pt who states she did not have the gas money to get here today.  Reminded of NS policy and to call and cancel if she is unable to make it.  Pt also cancelled her appt for tomorrow.  Reminded of next appt on Monday at 8:30am ? ?Mackie Holness Sula Soda, PTA/CLT, WTA ?[phone] ? ?

## 2022-04-04 ENCOUNTER — Encounter (HOSPITAL_COMMUNITY): Payer: Medicaid Other | Admitting: Physical Therapy

## 2022-04-07 ENCOUNTER — Encounter (HOSPITAL_COMMUNITY): Payer: Medicaid Other | Admitting: Physical Therapy

## 2022-04-09 ENCOUNTER — Encounter (HOSPITAL_COMMUNITY): Payer: Self-pay | Admitting: Physical Therapy

## 2022-04-09 ENCOUNTER — Encounter (HOSPITAL_COMMUNITY): Payer: Medicaid Other | Admitting: Physical Therapy

## 2022-04-09 NOTE — Telephone Encounter (Signed)
Pt 3rd no show. ?2nd no show 04/07/2022 ?3rd no show 04/09/2022 ? ?Per no show policy pt appointments will be cancelled.  If pt desires to continue therapy she will need a new MD order. ? ?Rayetta Humphrey, PT CLT ?[phone]  ?

## 2022-04-09 NOTE — Therapy (Signed)
[place] ?[place] ?[address] ?[place], [place], [address] ?Phone: [phone]   Fax:  [phone] ? ?Patient Details  ?Name: Tiffany Barry ?MRN: [id number] ?Date of Birth: 08-11-1971 ?Referring Provider:  No ref. provider found ? ?Encounter Date: 04/09/2022 ?PHYSICAL THERAPY DISCHARGE SUMMARY ? ?Visits from Start of Care: 9 ? ?Current functional level related to goals / functional outcomes: ?Pt ambulating without assistive device.  ?  ?Remaining deficits: ?At last appointment 4/17 pt was still complaining of pain ?  ?Education / Equipment: ?HEP  ? ?Patient agrees to discharge. Patient goals were partially met. Patient is being discharged due to not returning since the last visit.  ? ?Rayetta Humphrey, PT CLT ?[phone] 04/09/2022, 9:35 AM ? ?[place] ?[place] ?[address] ?[place], [place], [address] ?Phone: [phone]   Fax:  [phone] ?

## 2022-04-25 ENCOUNTER — Other Ambulatory Visit: Payer: Self-pay | Admitting: Orthopedic Surgery

## 2022-04-25 MED ORDER — CYCLOBENZAPRINE HCL 10 MG PO TABS: 10.0000 mg | ORAL_TABLET | Freq: Two times a day (BID) | ORAL | 0 refills | Status: DC | PRN

## 2022-04-30 ENCOUNTER — Encounter: Payer: Medicaid Other | Admitting: Orthopedic Surgery

## 2022-05-06 ENCOUNTER — Ambulatory Visit (INDEPENDENT_AMBULATORY_CARE_PROVIDER_SITE_OTHER): Payer: Medicaid Other

## 2022-05-06 ENCOUNTER — Encounter: Payer: Self-pay | Admitting: Orthopedic Surgery

## 2022-05-06 ENCOUNTER — Ambulatory Visit (INDEPENDENT_AMBULATORY_CARE_PROVIDER_SITE_OTHER): Payer: Medicaid Other | Admitting: Orthopedic Surgery

## 2022-05-06 DIAGNOSIS — S72001A Fracture of unspecified part of neck of right femur, initial encounter for closed fracture: Secondary | ICD-10-CM

## 2022-05-06 DIAGNOSIS — S72041D Displaced fracture of base of neck of right femur, subsequent encounter for closed fracture with routine healing: Secondary | ICD-10-CM

## 2022-05-06 MED ORDER — PREGABALIN 50 MG PO CAPS: 50.0000 mg | ORAL_CAPSULE | Freq: Three times a day (TID) | ORAL | 0 refills | Status: DC

## 2022-05-06 NOTE — Patient Instructions (Signed)
Ok to return to work, effective immediately.  No restrictions

## 2022-05-07 ENCOUNTER — Encounter: Payer: Self-pay | Admitting: Orthopedic Surgery

## 2022-05-07 NOTE — Progress Notes (Signed)
Orthopaedic Postop Note  Assessment: Tiffany Barry is a 51 y.o. female s/p cepahlomedullary nail for a right intertrochanteric femur fracture  DOS: 02/07/22  Plan: Tiffany Barry has improved since I saw her last.  Her pain is much better.  She is walking without assistive device.  She does appear to have a small leg length discrepancy.  Recommended she consider a lift in her shoe.  She is concerned about the cost.  She has started to have tingling and burning sensations in both legs.  She has tried gabapentin in the past, but these caused some side effects.  We will try Lyrica for her same symptoms.  I would like to see her back in 3 months.  Meds ordered this encounter  Medications   pregabalin (LYRICA) 50 MG capsule    Sig: Take 1 capsule (50 mg total) by mouth 3 (three) times daily.    Dispense:  90 capsule    Refill:  0     Follow-up: Return in about 3 months (around 08/06/2022). XR at next visit: AP pelvis, R femur  Subjective:  Chief Complaint  Patient presents with   Hip Injury    RT femur/ DOS 02/07/22 Pt is improving    History of Present Illness: Tiffany Barry is a 51 y.o. female who presents following the above stated procedure.  Surgery was 3 months ago.  She is doing much better.  Her pain is better.  She does continue to walk with a limp.  She does feel unstable.  She feels as though her right leg is a little bit shorter than her left.  More concerning, over the past 2-3 weeks, she has noted burning type sensations in both legs.  This is causing her to move both legs.  This is very painful for her.  She has tried gabapentin, which were helping with the symptoms, but caused some issues with her thinking, so she stopped taking the gabapentin.  Review of Systems: No fevers or chills No numbness or tingling No Chest Pain No shortness of breath   Objective: LMP 06/20/2011   Physical Exam:  Alert and oriented.  No acute distress.  Right sided antalgic gait, without  assistive device.  Surgical incisions are healing well.  No surrounding erythema or drainage.  Clinically, she has an approximately 1 cm leg length discrepancy, right leg shorter than the left.  She has some atrophy of the quadriceps on the right compared to the left.  She is able to maintain a straight leg raise.  Minimal pain with gentle range of motion of the right hip.  Toes are warm and well-perfused.   IMAGING: I personally ordered and reviewed the following images:  AP pelvis and right femur x-rays demonstrates appropriate alignment.  There is evidence of interval callus formation at the fracture site.  Overall alignment remains unchanged.  No evidence of hardware failure.  No screw cut out of the lag screw.  No acute injuries are noted.  Impression: Right basicervical femoral neck fracture in stable position without hardware failure.  Oliver Barre, MD 05/07/2022 9:49 AM

## 2022-05-22 ENCOUNTER — Other Ambulatory Visit: Payer: Self-pay

## 2022-05-22 MED ORDER — CYCLOBENZAPRINE HCL 10 MG PO TABS: 10.0000 mg | ORAL_TABLET | Freq: Two times a day (BID) | ORAL | 0 refills | Status: DC | PRN

## 2022-06-27 ENCOUNTER — Other Ambulatory Visit: Payer: Self-pay | Admitting: Orthopedic Surgery

## 2022-08-01 DIAGNOSIS — R58 Hemorrhage, not elsewhere classified: Secondary | ICD-10-CM | POA: Diagnosis not present

## 2022-08-01 DIAGNOSIS — R109 Unspecified abdominal pain: Secondary | ICD-10-CM | POA: Diagnosis not present

## 2022-08-01 DIAGNOSIS — H9203 Otalgia, bilateral: Secondary | ICD-10-CM | POA: Diagnosis not present

## 2022-08-01 DIAGNOSIS — R058 Other specified cough: Secondary | ICD-10-CM | POA: Diagnosis not present

## 2022-08-01 DIAGNOSIS — J029 Acute pharyngitis, unspecified: Secondary | ICD-10-CM | POA: Diagnosis not present

## 2022-08-01 DIAGNOSIS — K649 Unspecified hemorrhoids: Secondary | ICD-10-CM | POA: Diagnosis not present

## 2022-08-01 DIAGNOSIS — J324 Chronic pansinusitis: Secondary | ICD-10-CM | POA: Diagnosis not present

## 2022-08-01 DIAGNOSIS — J3489 Other specified disorders of nose and nasal sinuses: Secondary | ICD-10-CM | POA: Diagnosis not present

## 2022-08-06 ENCOUNTER — Ambulatory Visit: Payer: Medicaid Other | Admitting: Orthopedic Surgery

## 2022-08-08 ENCOUNTER — Ambulatory Visit: Payer: Medicaid Other | Admitting: Orthopedic Surgery

## 2022-08-11 ENCOUNTER — Encounter: Payer: Self-pay | Admitting: Orthopedic Surgery

## 2022-08-11 ENCOUNTER — Ambulatory Visit (INDEPENDENT_AMBULATORY_CARE_PROVIDER_SITE_OTHER): Payer: Medicaid Other | Admitting: Orthopedic Surgery

## 2022-08-11 ENCOUNTER — Other Ambulatory Visit: Payer: Self-pay | Admitting: Adult Health

## 2022-08-11 ENCOUNTER — Ambulatory Visit (INDEPENDENT_AMBULATORY_CARE_PROVIDER_SITE_OTHER): Payer: Medicaid Other

## 2022-08-11 DIAGNOSIS — S72041D Displaced fracture of base of neck of right femur, subsequent encounter for closed fracture with routine healing: Secondary | ICD-10-CM

## 2022-08-11 MED ORDER — FLUCONAZOLE 150 MG PO TABS: ORAL_TABLET | ORAL | 1 refills | Status: AC

## 2022-08-11 MED ORDER — PREGABALIN 50 MG PO CAPS: 50.0000 mg | ORAL_CAPSULE | Freq: Three times a day (TID) | ORAL | 0 refills | Status: DC

## 2022-08-11 NOTE — Progress Notes (Signed)
Orthopaedic Postop Note  Assessment: Tiffany Barry is a 51 y.o. female s/p cepahlomedullary nail for a right intertrochanteric femur fracture  DOS: 02/07/22  Plan: Tiffany Barry continues to improve.  She is walking well, without a limp at this time.  However, she is aware of her leg length discrepancy, and she states this creates issues for her.  She has tried over-the-counter lifts in her shoes, with limited improvement in her symptoms.  We discussed proceeding with custom orthotics, but this is potentially expensive.  Nonetheless, we provided her with a prescription.  In addition, she notes radiating pains in both legs, which is not likely directly related to her recent surgery.  She states Lyrica helps, but wonders if she needs to take it more frequently.  I provided her with a refill.  Urged her to continue working on strengthening, as she can see improvements for up to a year following surgery.  She states her understanding.  Follow-up as needed.  Meds ordered this encounter  Medications   pregabalin (LYRICA) 50 MG capsule    Sig: Take 1 capsule (50 mg total) by mouth 3 (three) times daily.    Dispense:  90 capsule    Refill:  0     Follow-up: Return if symptoms worsen or fail to improve. XR at next visit: AP pelvis, R femur  Subjective:  Chief Complaint  Patient presents with   Routine Post Op    RT hip DOS 02/07/22    History of Present Illness: Tiffany Barry is a 51 y.o. female who presents following the above stated procedure.  Surgery was 6 months ago.  Once again, she is improved since I last saw her.  She is ambulating well, with minimal pain.  She does note a difference in the leg lengths.  She has tried some over-the-counter orthotics, which helped for short period of time, but then she feels as though she loses the height, and her discomfort returns.  She notes occasional shooting pains in both legs.  No specific onset.  She does have a history of SI fusion on the right.   She has taken Lyrica, which was helping, but she wonders if she needs to take this more frequently.  No fevers or chills.  No numbness or tingling.   Review of Systems: No fevers or chills No numbness or tingling No Chest Pain No shortness of breath   Objective: LMP 06/20/2011   Physical Exam:  Alert and oriented.  No acute distress.  Right sided antalgic gait  Lateral hip surgical incisions have healed.  No surrounding erythema or drainage.  She tolerates gentle range of motion, and axial loading.  She can maintain straight leg raise.  Mild tenderness to palpation of the lag screw incision.  Sensation is intact distally.  Clinically, approximately 1 cm leg length discrepancy, right shorter than left.   IMAGING: I personally ordered and reviewed the following images:  AP pelvis and right femur x-rays were obtained in clinic today.  These were compared to prior x-rays.  There has been no interval displacement at the fracture site.  It does appear to be fused.  Based on available imaging, there does appear to be a slight leg length discrepancy, left longer than right.  No dislocation.  No acute injuries.  Impression: Right basicervical femoral neck fracture, in stable alignment without hardware failure.   Oliver Barre, MD 08/11/2022 10:12 PM

## 2022-08-11 NOTE — Progress Notes (Signed)
Rx diflucan.  

## 2022-08-20 DIAGNOSIS — K602 Anal fissure, unspecified: Secondary | ICD-10-CM | POA: Diagnosis not present

## 2022-09-22 DIAGNOSIS — E785 Hyperlipidemia, unspecified: Secondary | ICD-10-CM | POA: Diagnosis not present

## 2022-09-22 DIAGNOSIS — K5900 Constipation, unspecified: Secondary | ICD-10-CM | POA: Diagnosis not present

## 2022-09-22 DIAGNOSIS — K602 Anal fissure, unspecified: Secondary | ICD-10-CM | POA: Diagnosis not present

## 2022-10-07 ENCOUNTER — Other Ambulatory Visit: Payer: Self-pay | Admitting: Orthopedic Surgery

## 2022-10-10 DIAGNOSIS — I878 Other specified disorders of veins: Secondary | ICD-10-CM | POA: Diagnosis not present

## 2022-10-10 DIAGNOSIS — R413 Other amnesia: Secondary | ICD-10-CM | POA: Diagnosis not present

## 2022-10-10 DIAGNOSIS — Z131 Encounter for screening for diabetes mellitus: Secondary | ICD-10-CM | POA: Diagnosis not present

## 2022-10-10 DIAGNOSIS — K8020 Calculus of gallbladder without cholecystitis without obstruction: Secondary | ICD-10-CM | POA: Diagnosis not present

## 2022-10-10 DIAGNOSIS — R140 Abdominal distension (gaseous): Secondary | ICD-10-CM | POA: Diagnosis not present

## 2022-10-14 DIAGNOSIS — K760 Fatty (change of) liver, not elsewhere classified: Secondary | ICD-10-CM | POA: Diagnosis not present

## 2022-10-14 DIAGNOSIS — K824 Cholesterolosis of gallbladder: Secondary | ICD-10-CM | POA: Diagnosis not present

## 2022-10-14 DIAGNOSIS — R140 Abdominal distension (gaseous): Secondary | ICD-10-CM | POA: Diagnosis not present

## 2022-10-17 DIAGNOSIS — K828 Other specified diseases of gallbladder: Secondary | ICD-10-CM | POA: Diagnosis not present

## 2022-10-17 DIAGNOSIS — R1084 Generalized abdominal pain: Secondary | ICD-10-CM | POA: Diagnosis not present

## 2022-10-17 DIAGNOSIS — K5900 Constipation, unspecified: Secondary | ICD-10-CM | POA: Diagnosis not present

## 2022-10-17 DIAGNOSIS — R1032 Left lower quadrant pain: Secondary | ICD-10-CM | POA: Diagnosis not present

## 2022-10-28 DIAGNOSIS — K5730 Diverticulosis of large intestine without perforation or abscess without bleeding: Secondary | ICD-10-CM | POA: Diagnosis not present

## 2022-10-28 DIAGNOSIS — R932 Abnormal findings on diagnostic imaging of liver and biliary tract: Secondary | ICD-10-CM | POA: Diagnosis not present

## 2022-10-28 DIAGNOSIS — K929 Disease of digestive system, unspecified: Secondary | ICD-10-CM | POA: Diagnosis not present

## 2022-10-28 DIAGNOSIS — R933 Abnormal findings on diagnostic imaging of other parts of digestive tract: Secondary | ICD-10-CM | POA: Diagnosis not present

## 2022-10-28 DIAGNOSIS — I700 Atherosclerosis of aorta: Secondary | ICD-10-CM | POA: Diagnosis not present

## 2022-10-28 DIAGNOSIS — R1032 Left lower quadrant pain: Secondary | ICD-10-CM | POA: Diagnosis not present

## 2022-11-10 DIAGNOSIS — R202 Paresthesia of skin: Secondary | ICD-10-CM | POA: Diagnosis not present

## 2022-11-10 DIAGNOSIS — R112 Nausea with vomiting, unspecified: Secondary | ICD-10-CM | POA: Diagnosis not present

## 2022-11-10 DIAGNOSIS — K5904 Chronic idiopathic constipation: Secondary | ICD-10-CM | POA: Diagnosis not present

## 2022-11-10 DIAGNOSIS — R1084 Generalized abdominal pain: Secondary | ICD-10-CM | POA: Diagnosis not present

## 2022-11-10 DIAGNOSIS — R200 Anesthesia of skin: Secondary | ICD-10-CM | POA: Diagnosis not present

## 2022-11-18 DIAGNOSIS — K5909 Other constipation: Secondary | ICD-10-CM | POA: Diagnosis not present

## 2022-11-18 DIAGNOSIS — R109 Unspecified abdominal pain: Secondary | ICD-10-CM | POA: Diagnosis not present

## 2022-12-12 ENCOUNTER — Other Ambulatory Visit: Payer: Self-pay | Admitting: Orthopedic Surgery

## 2022-12-19 DIAGNOSIS — R1010 Upper abdominal pain, unspecified: Secondary | ICD-10-CM | POA: Diagnosis not present

## 2022-12-19 DIAGNOSIS — K824 Cholesterolosis of gallbladder: Secondary | ICD-10-CM | POA: Diagnosis not present

## 2022-12-19 DIAGNOSIS — R933 Abnormal findings on diagnostic imaging of other parts of digestive tract: Secondary | ICD-10-CM | POA: Diagnosis not present

## 2023-01-13 DIAGNOSIS — R1010 Upper abdominal pain, unspecified: Secondary | ICD-10-CM | POA: Diagnosis not present

## 2023-01-13 DIAGNOSIS — R1030 Lower abdominal pain, unspecified: Secondary | ICD-10-CM | POA: Diagnosis not present

## 2023-01-13 DIAGNOSIS — R112 Nausea with vomiting, unspecified: Secondary | ICD-10-CM | POA: Diagnosis not present

## 2023-01-13 DIAGNOSIS — K829 Disease of gallbladder, unspecified: Secondary | ICD-10-CM | POA: Diagnosis not present

## 2023-01-13 DIAGNOSIS — K760 Fatty (change of) liver, not elsewhere classified: Secondary | ICD-10-CM | POA: Diagnosis not present

## 2023-01-13 DIAGNOSIS — R933 Abnormal findings on diagnostic imaging of other parts of digestive tract: Secondary | ICD-10-CM | POA: Diagnosis not present

## 2023-01-27 DIAGNOSIS — F32A Depression, unspecified: Secondary | ICD-10-CM | POA: Diagnosis not present

## 2023-01-27 DIAGNOSIS — R5382 Chronic fatigue, unspecified: Secondary | ICD-10-CM | POA: Diagnosis not present

## 2023-01-27 DIAGNOSIS — J449 Chronic obstructive pulmonary disease, unspecified: Secondary | ICD-10-CM | POA: Diagnosis not present

## 2023-02-12 ENCOUNTER — Encounter: Payer: Self-pay | Admitting: Radiology

## 2023-02-24 DIAGNOSIS — Z8719 Personal history of other diseases of the digestive system: Secondary | ICD-10-CM | POA: Diagnosis not present

## 2023-02-24 DIAGNOSIS — Z1211 Encounter for screening for malignant neoplasm of colon: Secondary | ICD-10-CM | POA: Diagnosis not present

## 2023-02-24 DIAGNOSIS — K648 Other hemorrhoids: Secondary | ICD-10-CM | POA: Diagnosis not present

## 2023-02-24 DIAGNOSIS — R933 Abnormal findings on diagnostic imaging of other parts of digestive tract: Secondary | ICD-10-CM | POA: Diagnosis not present

## 2023-02-24 DIAGNOSIS — Z86010 Personal history of colonic polyps: Secondary | ICD-10-CM | POA: Diagnosis not present

## 2023-05-12 DIAGNOSIS — K648 Other hemorrhoids: Secondary | ICD-10-CM | POA: Diagnosis not present

## 2023-05-12 DIAGNOSIS — K5641 Fecal impaction: Secondary | ICD-10-CM | POA: Diagnosis not present

## 2023-05-21 DIAGNOSIS — K5641 Fecal impaction: Secondary | ICD-10-CM | POA: Diagnosis not present

## 2023-05-21 DIAGNOSIS — M5416 Radiculopathy, lumbar region: Secondary | ICD-10-CM | POA: Diagnosis not present

## 2023-05-21 DIAGNOSIS — R202 Paresthesia of skin: Secondary | ICD-10-CM | POA: Diagnosis not present

## 2023-07-09 DIAGNOSIS — G608 Other hereditary and idiopathic neuropathies: Secondary | ICD-10-CM | POA: Diagnosis not present

## 2023-07-09 DIAGNOSIS — M5416 Radiculopathy, lumbar region: Secondary | ICD-10-CM | POA: Diagnosis not present

## 2023-08-12 DIAGNOSIS — M5416 Radiculopathy, lumbar region: Secondary | ICD-10-CM | POA: Diagnosis not present

## 2023-08-12 DIAGNOSIS — M5412 Radiculopathy, cervical region: Secondary | ICD-10-CM | POA: Diagnosis not present

## 2023-08-19 DIAGNOSIS — H5213 Myopia, bilateral: Secondary | ICD-10-CM | POA: Diagnosis not present

## 2023-09-17 DIAGNOSIS — M5416 Radiculopathy, lumbar region: Secondary | ICD-10-CM | POA: Diagnosis not present

## 2023-09-17 DIAGNOSIS — M4807 Spinal stenosis, lumbosacral region: Secondary | ICD-10-CM | POA: Diagnosis not present

## 2023-09-17 DIAGNOSIS — M4727 Other spondylosis with radiculopathy, lumbosacral region: Secondary | ICD-10-CM | POA: Diagnosis not present

## 2023-09-17 DIAGNOSIS — M4726 Other spondylosis with radiculopathy, lumbar region: Secondary | ICD-10-CM | POA: Diagnosis not present

## 2023-09-17 DIAGNOSIS — M48061 Spinal stenosis, lumbar region without neurogenic claudication: Secondary | ICD-10-CM | POA: Diagnosis not present

## 2023-11-27 DIAGNOSIS — G5733 Lesion of lateral popliteal nerve, bilateral lower limbs: Secondary | ICD-10-CM | POA: Diagnosis not present

## 2024-01-19 DIAGNOSIS — G5732 Lesion of lateral popliteal nerve, left lower limb: Secondary | ICD-10-CM | POA: Diagnosis not present

## 2024-01-19 DIAGNOSIS — F17210 Nicotine dependence, cigarettes, uncomplicated: Secondary | ICD-10-CM | POA: Diagnosis not present

## 2024-01-19 DIAGNOSIS — E785 Hyperlipidemia, unspecified: Secondary | ICD-10-CM | POA: Diagnosis not present

## 2024-01-19 DIAGNOSIS — Z79899 Other long term (current) drug therapy: Secondary | ICD-10-CM | POA: Diagnosis not present

## 2024-01-19 DIAGNOSIS — Z8673 Personal history of transient ischemic attack (TIA), and cerebral infarction without residual deficits: Secondary | ICD-10-CM | POA: Diagnosis not present

## 2024-01-29 DIAGNOSIS — G5732 Lesion of lateral popliteal nerve, left lower limb: Secondary | ICD-10-CM | POA: Diagnosis not present

## 2024-01-29 DIAGNOSIS — M25572 Pain in left ankle and joints of left foot: Secondary | ICD-10-CM | POA: Diagnosis not present

## 2024-01-29 DIAGNOSIS — S93492A Sprain of other ligament of left ankle, initial encounter: Secondary | ICD-10-CM | POA: Diagnosis not present

## 2024-01-31 IMAGING — DX DG HIP (WITH OR WITHOUT PELVIS) 2-3V*R*
3 series · 3 of 3 positions shown · non-contrast
Comparison: CT abdomen pelvis dated 11/10/2021.

CLINICAL DATA: Fall.  Right hip pain.

EXAM:
DG HIP (WITH OR WITHOUT PELVIS) 2-3V RIGHT

[hip ap]
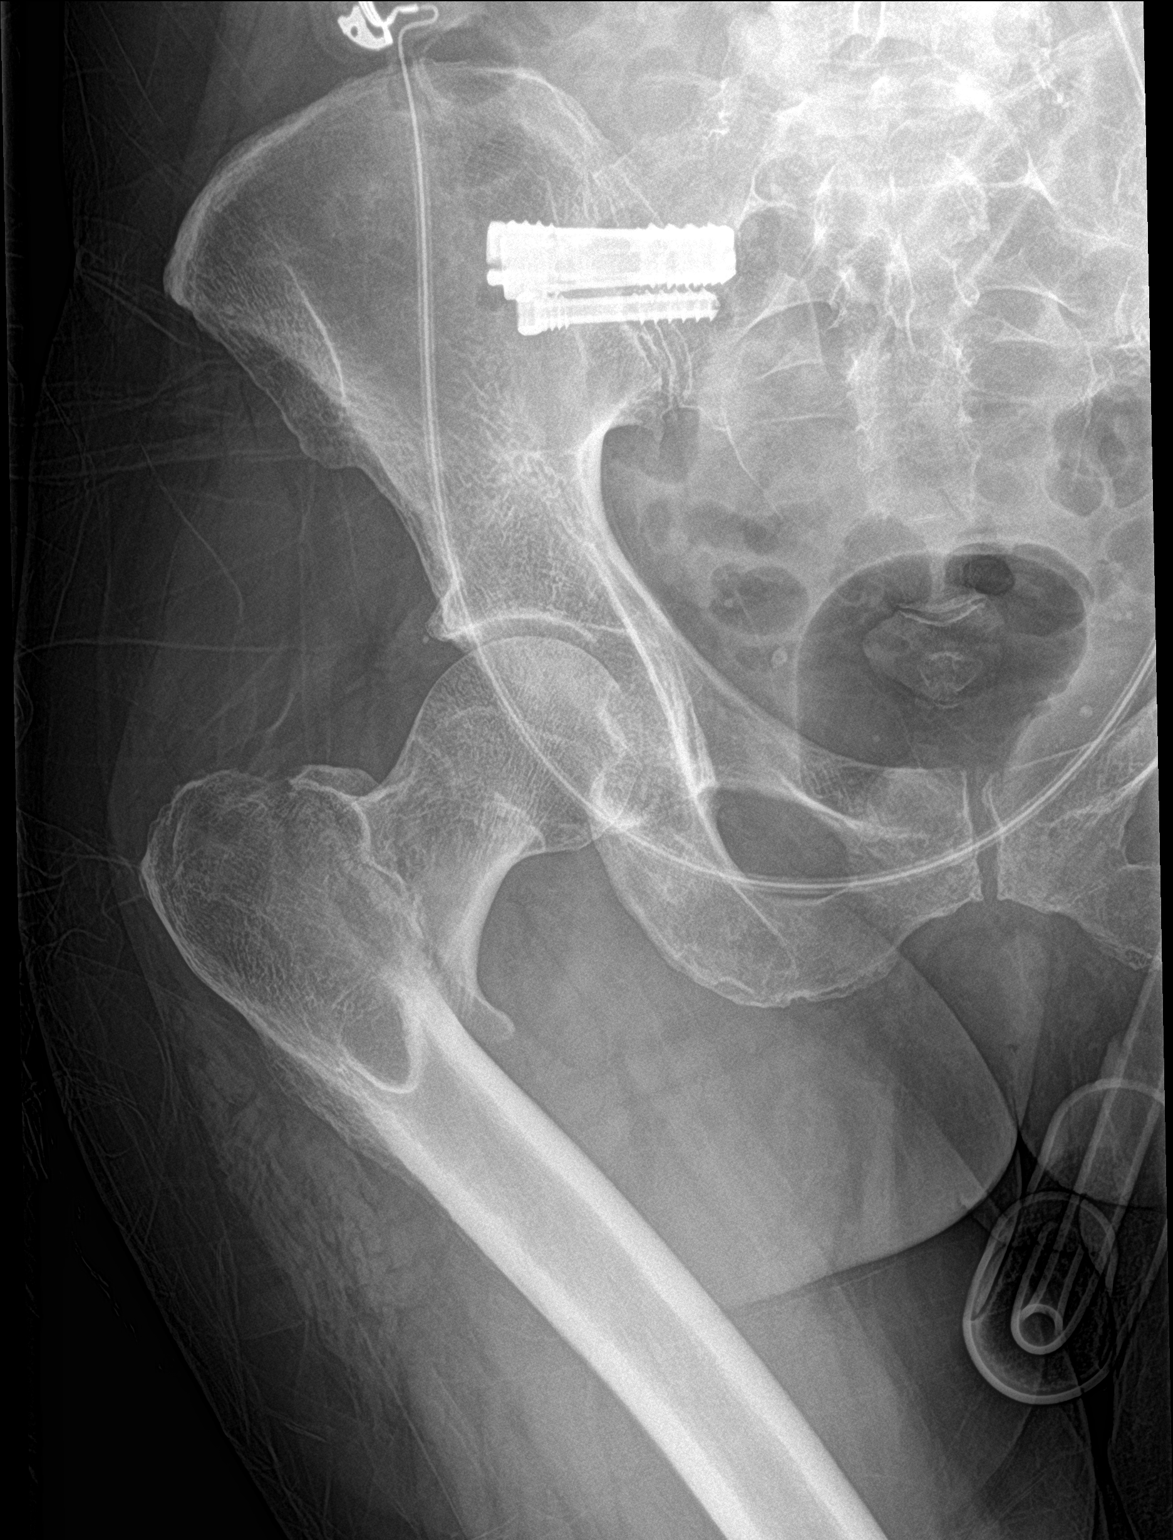

[hip lat xtable right]
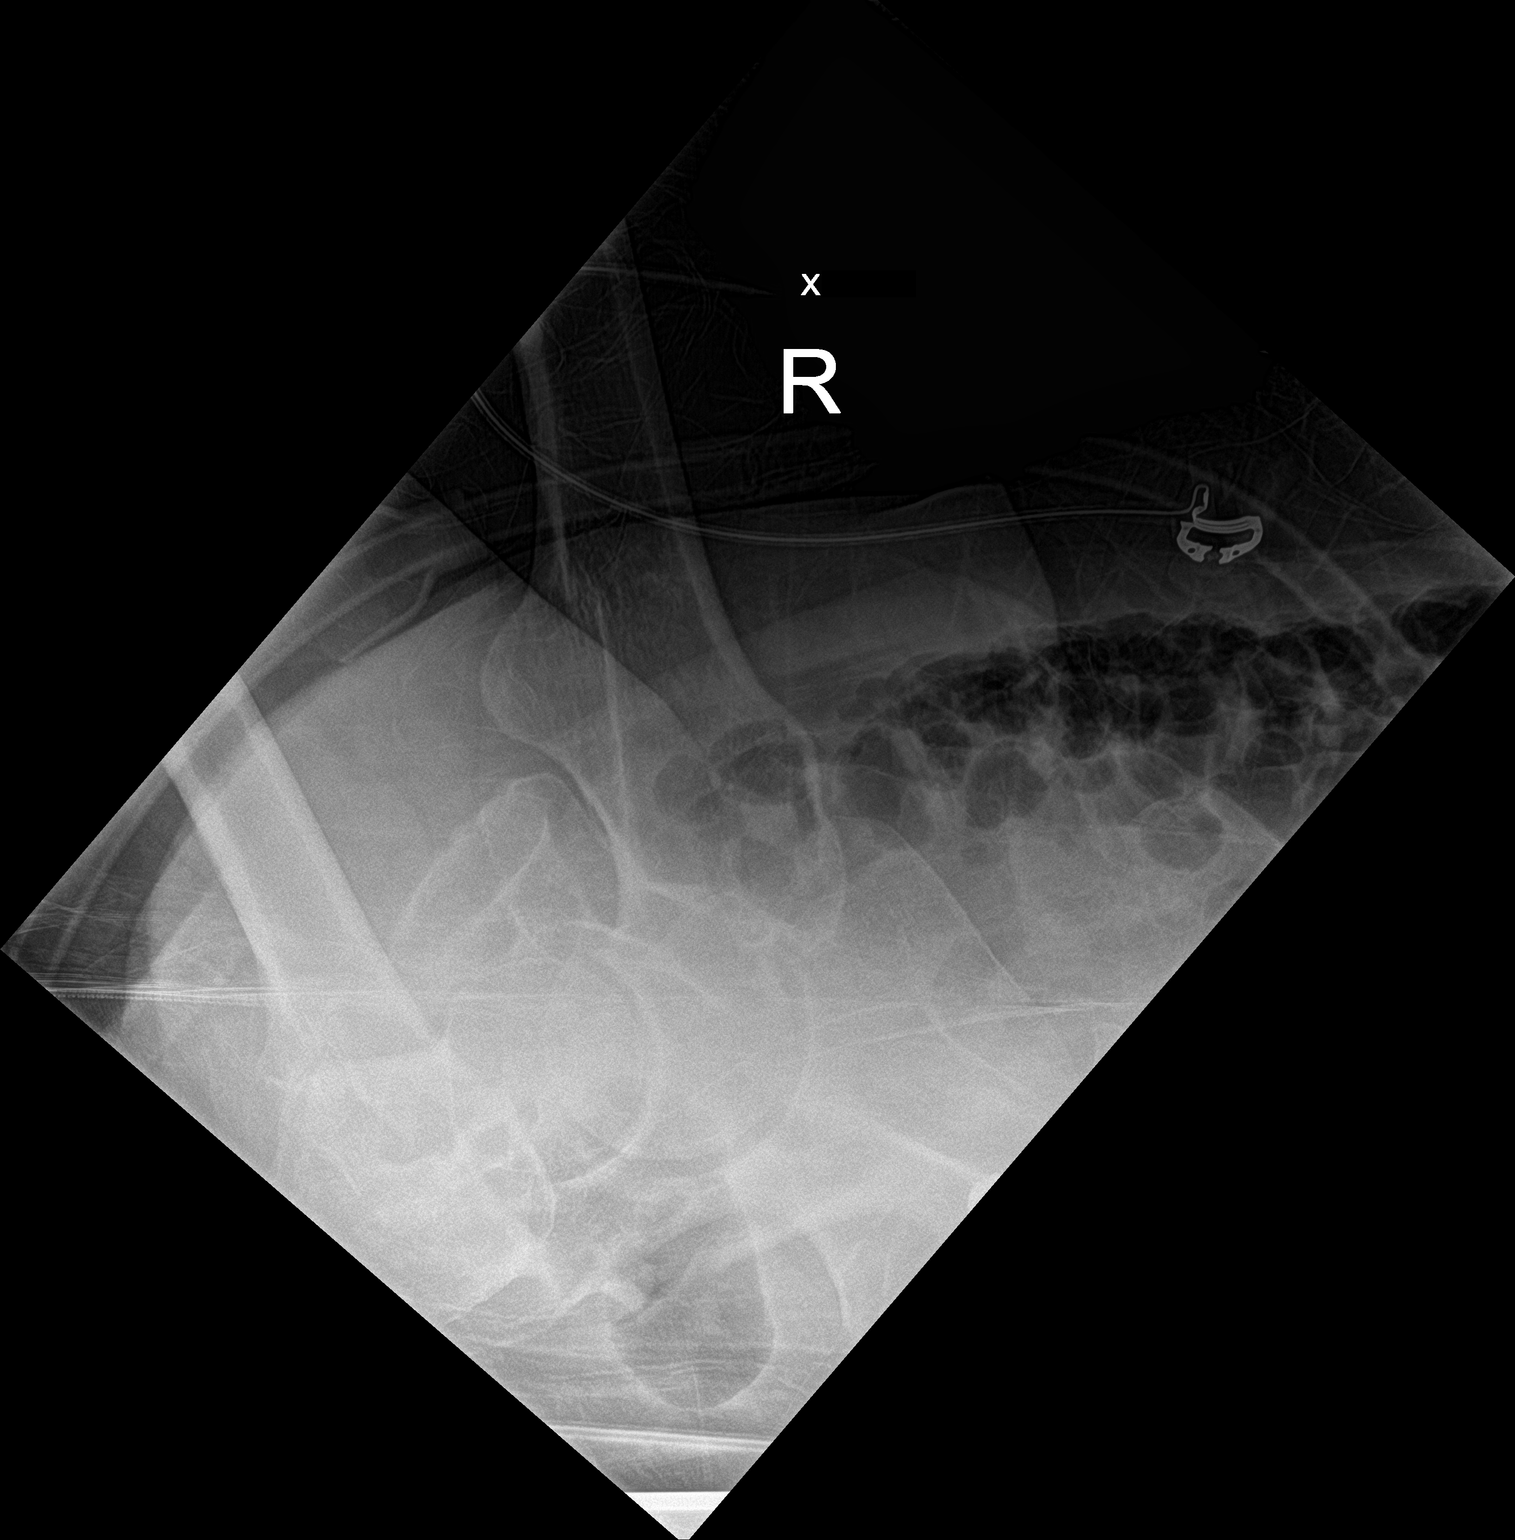

[pelvis ap]
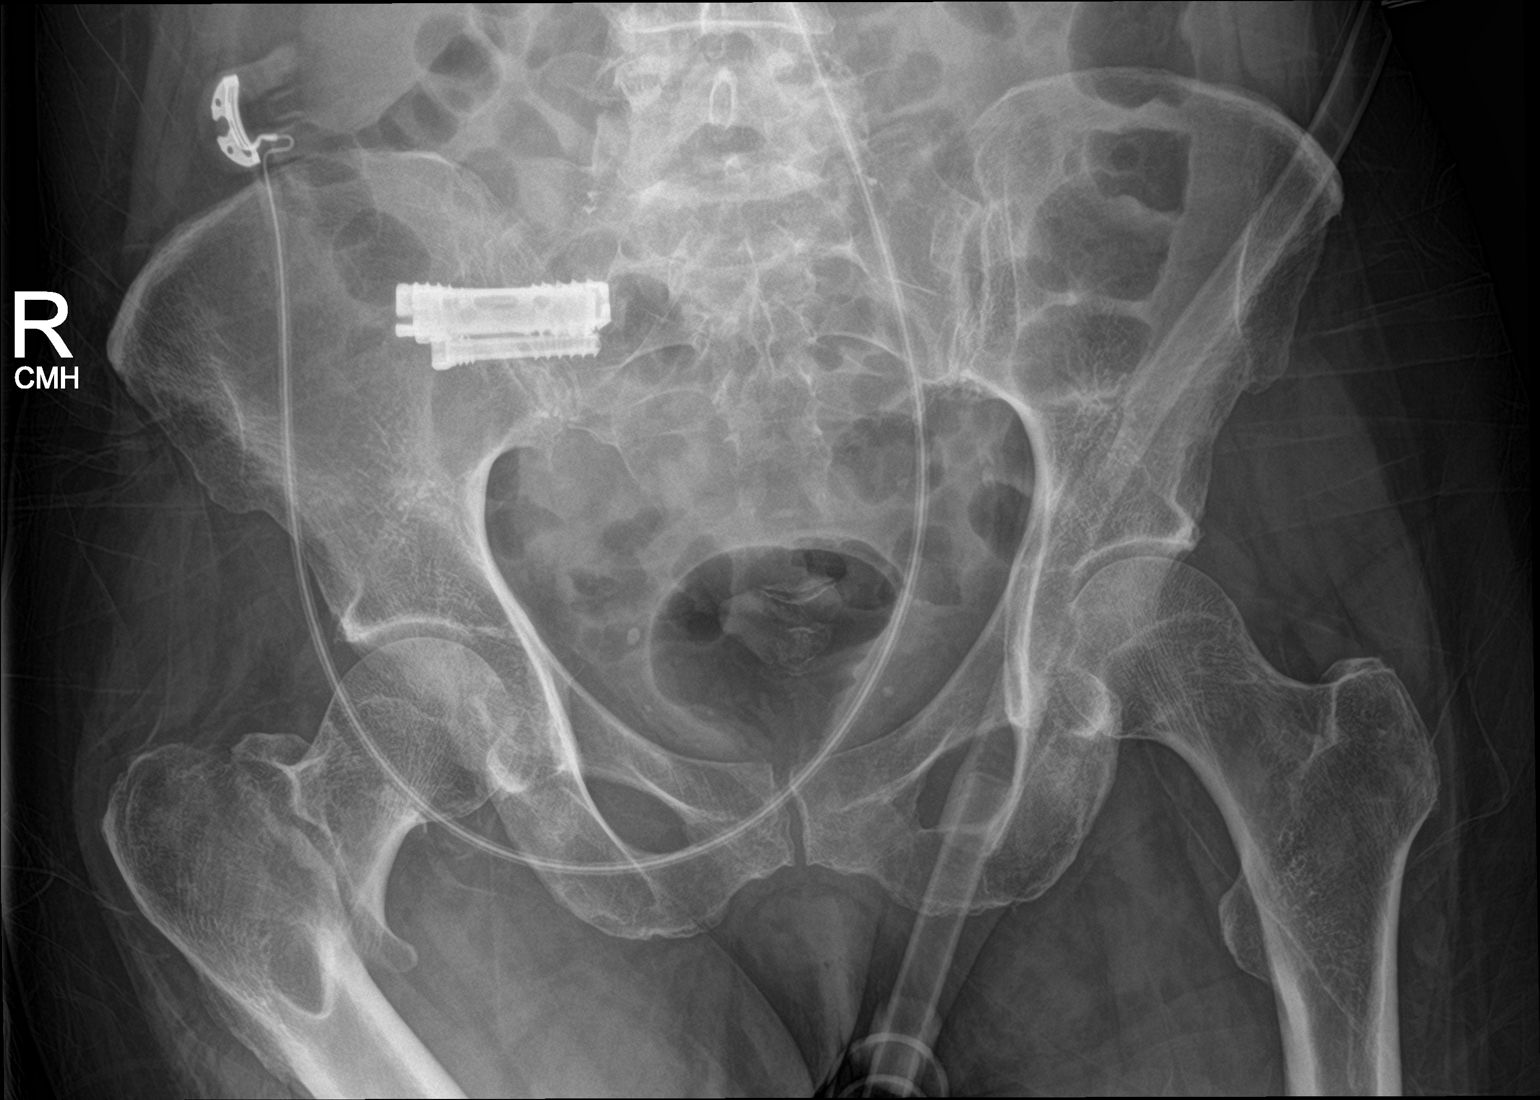

[3 of 3 positions shown; findings below may reference images not displayed]

FINDINGS: Displaced intertrochanteric fracture of the right femoral neck with
varus angulation. The bones are osteopenic. No dislocation. Fusion
screws through the right AC joint. The soft tissues are
unremarkable.
IMPRESSION: Displaced intertrochanteric fracture of the right femoral neck.

## 2024-01-31 IMAGING — DX DG PELVIS 1-2V
1 series · 1 of 1 positions shown · non-contrast
Comparison: None.

CLINICAL DATA: Postoperative evaluation.

EXAM:
PELVIS - 1-2 VIEW

[pelvis ap]
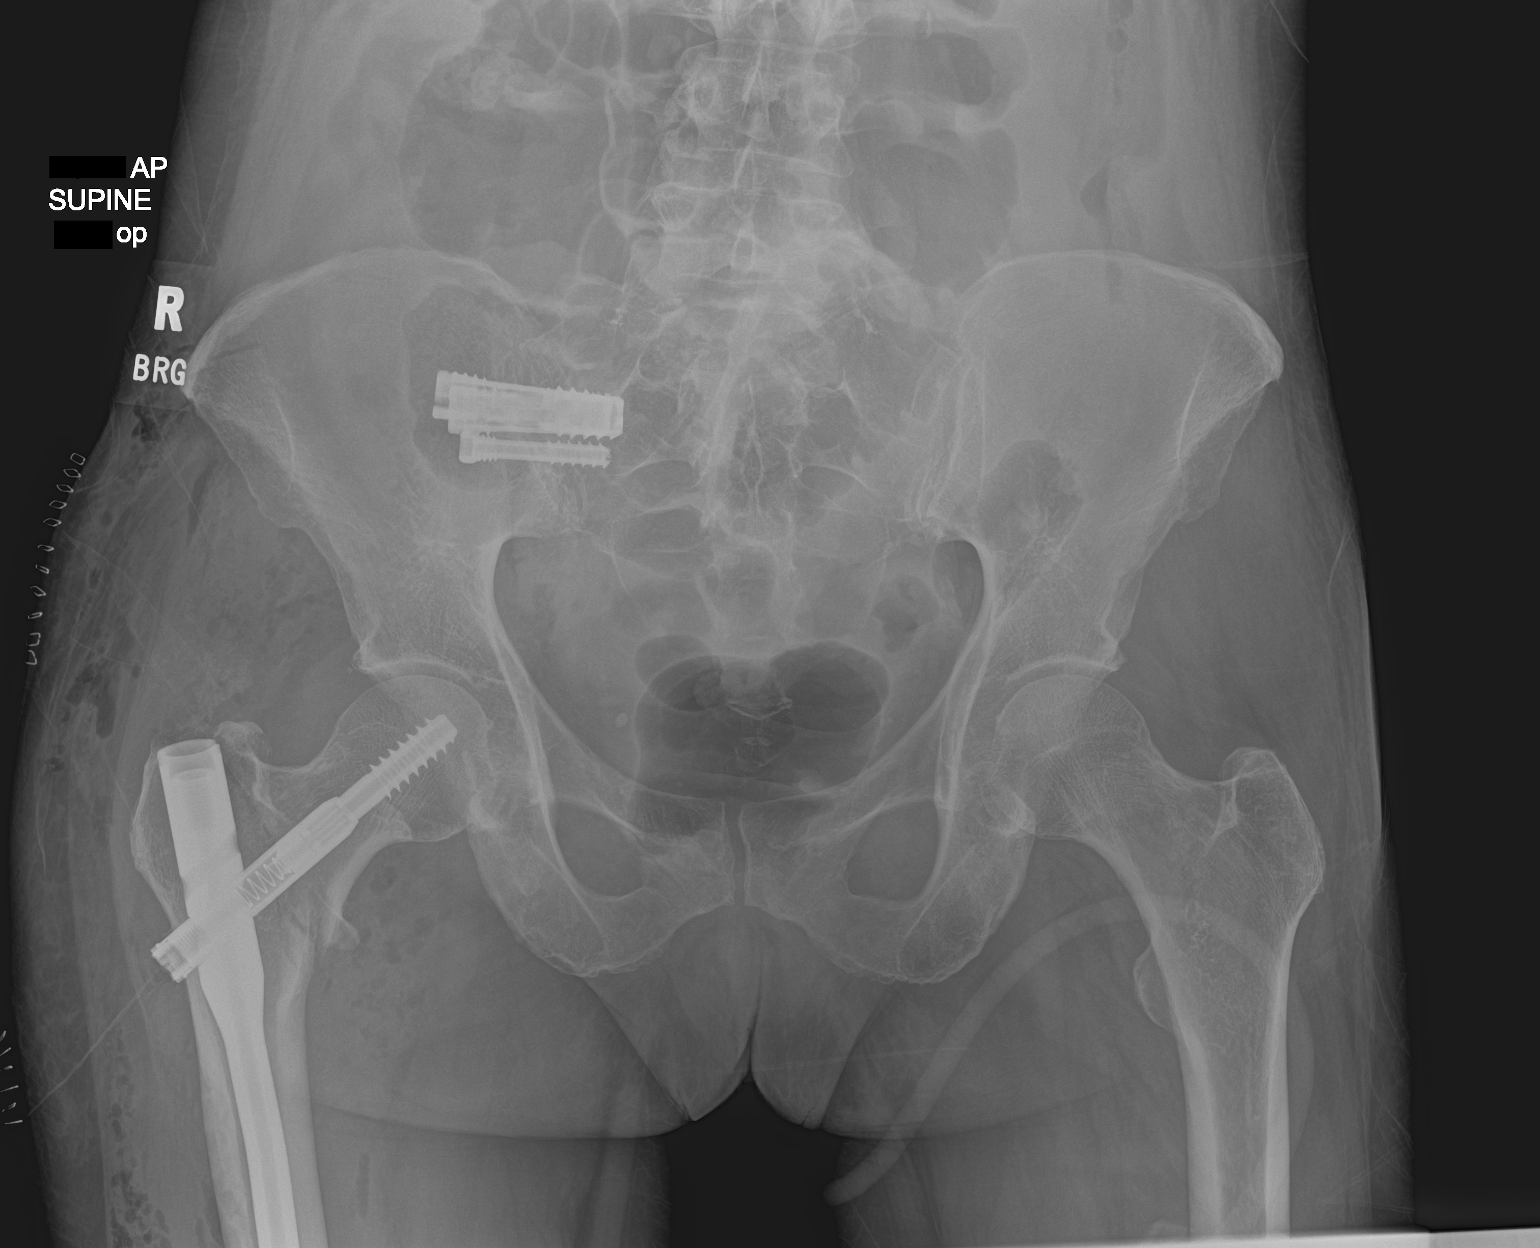

[1 of 1 positions shown; findings below may reference images not displayed]

FINDINGS: A radiopaque intramedullary rod and compression screw device is seen
within the proximal right femur. A nondisplaced inter trochanteric
fracture of the proximal right femur is also seen. There is no
evidence of dislocation. Radiopaque surgical screws are seen
extending across the right sacroiliac joint. A mild-to-moderate
amount of soft tissue air is seen along the lateral aspect of the
right hip with multiple radiopaque skin staples.
IMPRESSION: Status post internal fixation of an inter trochanteric fracture of
the proximal right femur.

## 2024-01-31 IMAGING — DX DG FEMUR 2+V*R*
4 series · 4 of 4 positions shown · non-contrast
Comparison: None.

CLINICAL DATA: Postoperative evaluation.

EXAM:
RIGHT FEMUR 2 VIEWS

[femur lat distal (1 of 2)]
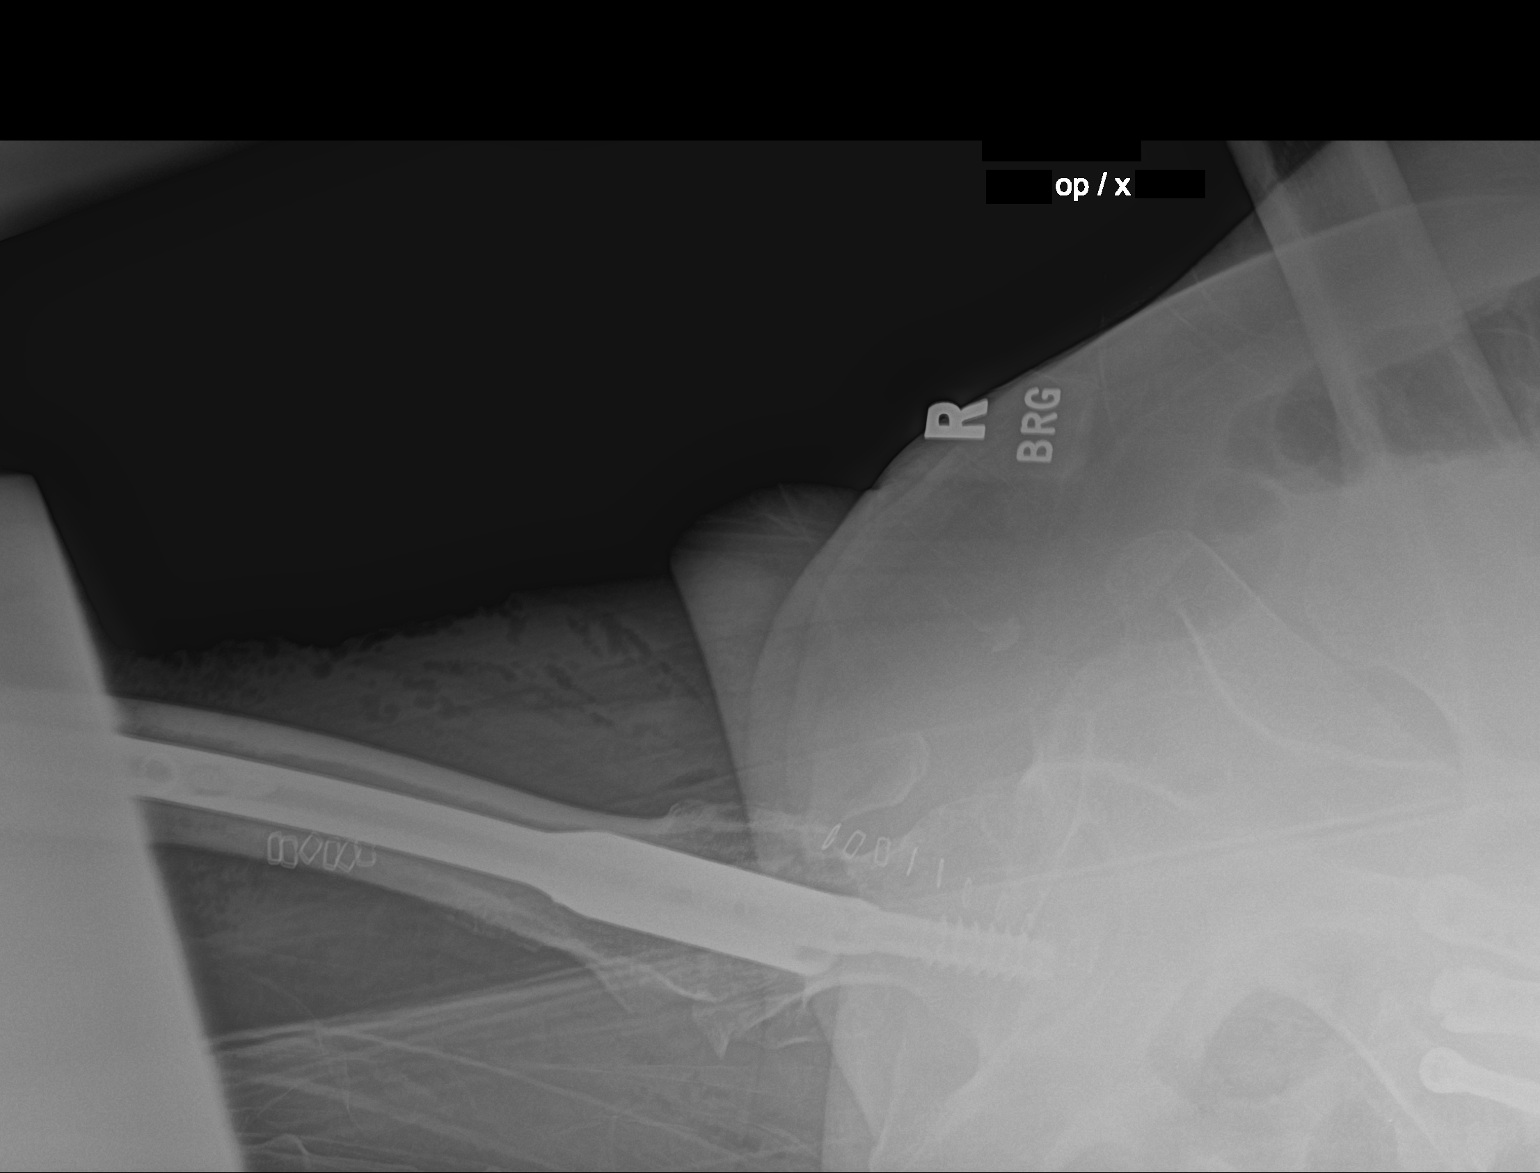

[femur ap proximal (1 of 2)]
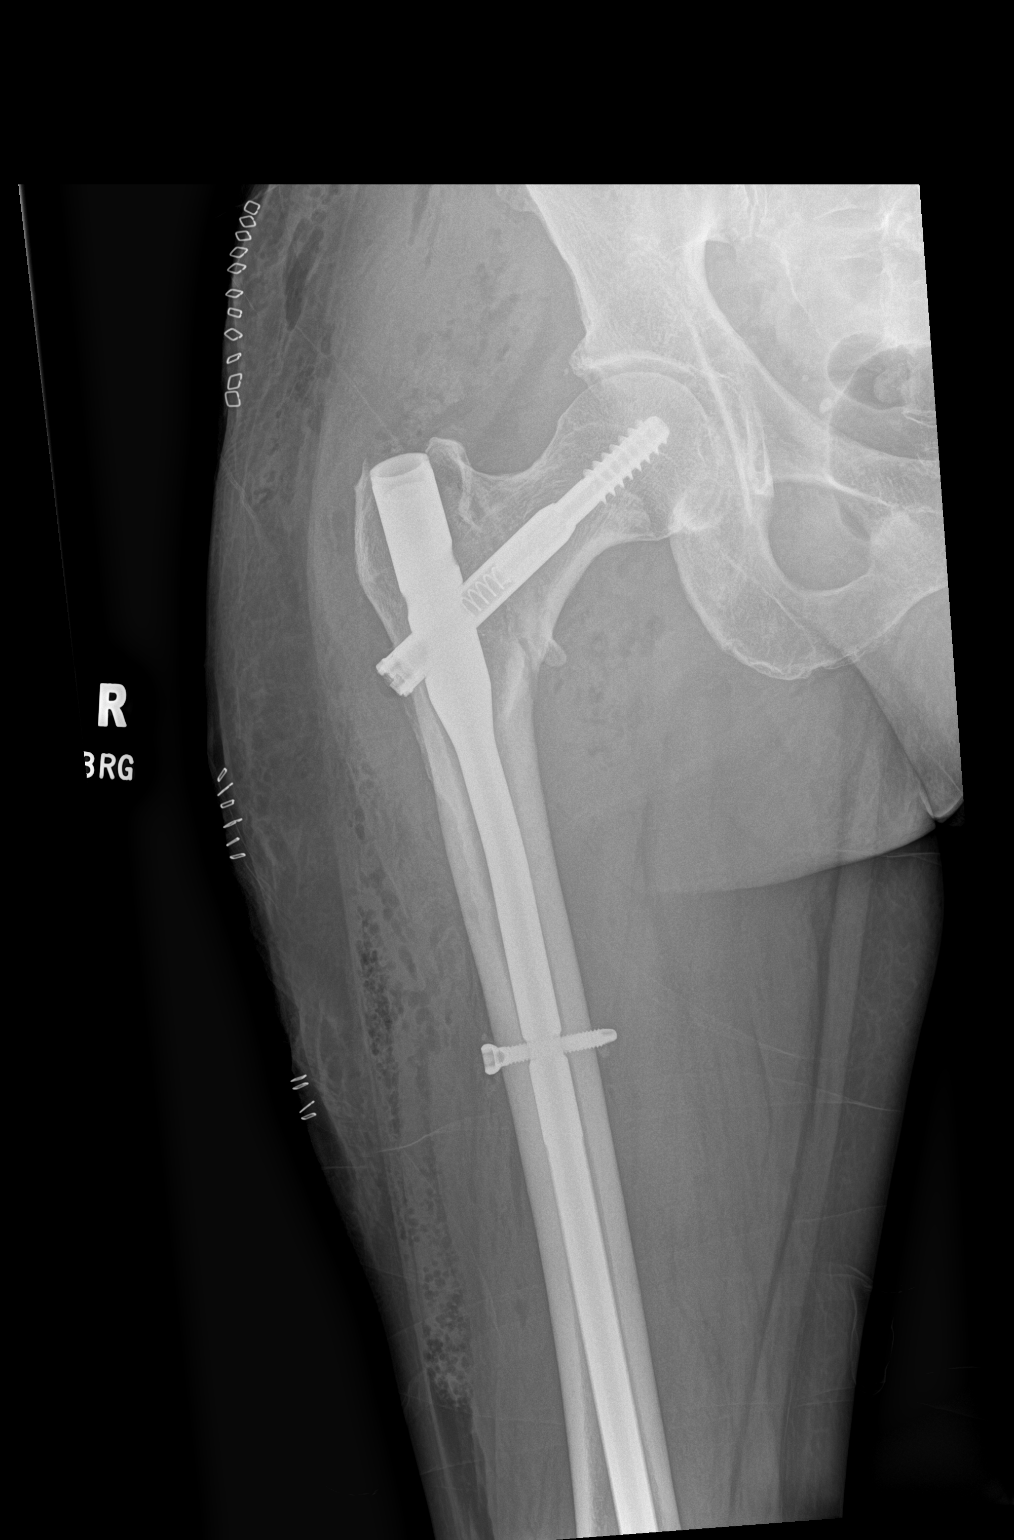

[femur ap proximal (2 of 2)]
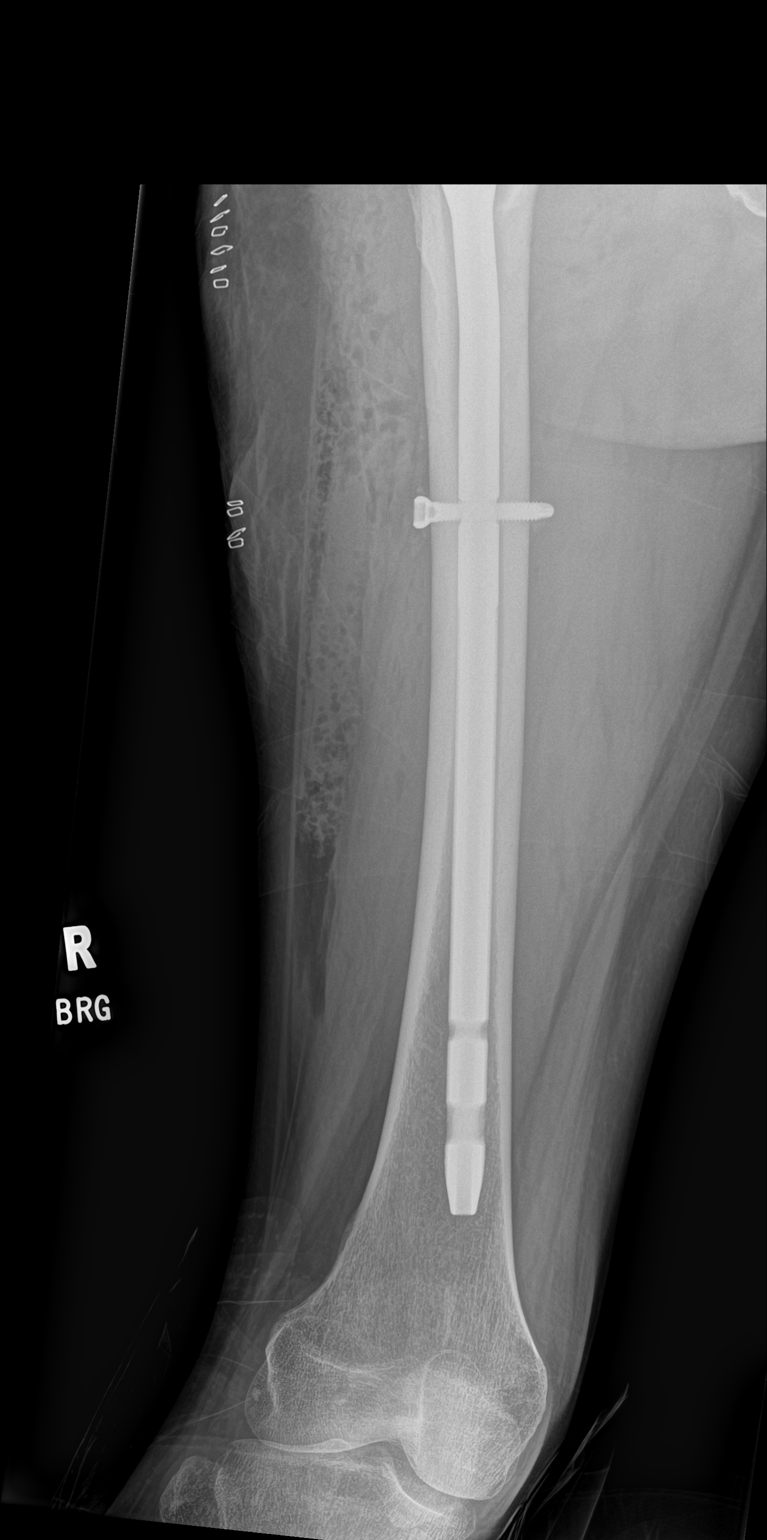

[femur lat distal (2 of 2)]
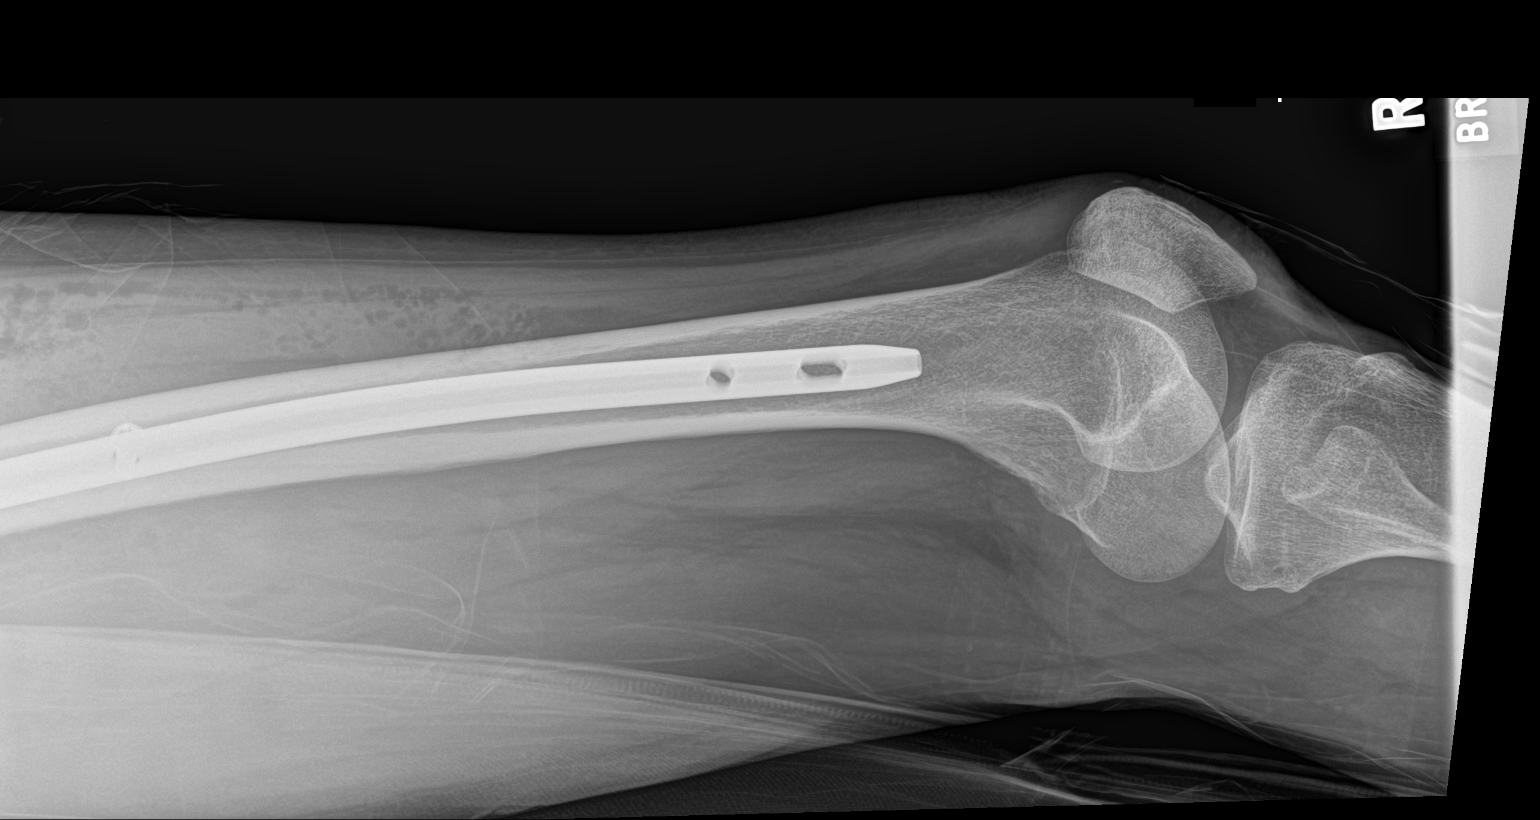

[4 of 4 positions shown; findings below may reference images not displayed]

FINDINGS: A radiopaque intramedullary rod and compression screw device are
seen within the proximal right femur. Acute, nondisplaced inter
trochanteric fracture of the proximal right femur is also noted.
Multiple radiopaque skin staples are seen.
IMPRESSION: Status post ORIF of the proximal right femur.

## 2024-03-21 DIAGNOSIS — R29818 Other symptoms and signs involving the nervous system: Secondary | ICD-10-CM | POA: Diagnosis not present

## 2024-03-21 DIAGNOSIS — D7589 Other specified diseases of blood and blood-forming organs: Secondary | ICD-10-CM | POA: Diagnosis not present

## 2024-03-21 DIAGNOSIS — R040 Epistaxis: Secondary | ICD-10-CM | POA: Diagnosis not present

## 2024-03-21 DIAGNOSIS — R5382 Chronic fatigue, unspecified: Secondary | ICD-10-CM | POA: Diagnosis not present

## 2024-03-21 DIAGNOSIS — J411 Mucopurulent chronic bronchitis: Secondary | ICD-10-CM | POA: Diagnosis not present

## 2024-03-21 DIAGNOSIS — R748 Abnormal levels of other serum enzymes: Secondary | ICD-10-CM | POA: Diagnosis not present

## 2024-03-21 DIAGNOSIS — R630 Anorexia: Secondary | ICD-10-CM | POA: Diagnosis not present

## 2024-03-21 DIAGNOSIS — K5904 Chronic idiopathic constipation: Secondary | ICD-10-CM | POA: Diagnosis not present

## 2024-04-18 DIAGNOSIS — Z1211 Encounter for screening for malignant neoplasm of colon: Secondary | ICD-10-CM | POA: Diagnosis not present

## 2024-04-18 DIAGNOSIS — Z720 Tobacco use: Secondary | ICD-10-CM | POA: Diagnosis not present

## 2024-04-18 DIAGNOSIS — Z0000 Encounter for general adult medical examination without abnormal findings: Secondary | ICD-10-CM | POA: Diagnosis not present

## 2024-04-18 DIAGNOSIS — E785 Hyperlipidemia, unspecified: Secondary | ICD-10-CM | POA: Diagnosis not present

## 2024-05-05 DIAGNOSIS — Z87891 Personal history of nicotine dependence: Secondary | ICD-10-CM | POA: Diagnosis not present

## 2024-05-05 DIAGNOSIS — F17210 Nicotine dependence, cigarettes, uncomplicated: Secondary | ICD-10-CM | POA: Diagnosis not present

## 2024-06-15 DIAGNOSIS — Z9189 Other specified personal risk factors, not elsewhere classified: Secondary | ICD-10-CM | POA: Diagnosis not present

## 2024-06-15 DIAGNOSIS — F32A Depression, unspecified: Secondary | ICD-10-CM | POA: Diagnosis not present

## 2024-06-15 DIAGNOSIS — Z8673 Personal history of transient ischemic attack (TIA), and cerebral infarction without residual deficits: Secondary | ICD-10-CM | POA: Diagnosis not present

## 2024-07-01 DIAGNOSIS — R519 Headache, unspecified: Secondary | ICD-10-CM | POA: Diagnosis not present

## 2024-07-01 DIAGNOSIS — J411 Mucopurulent chronic bronchitis: Secondary | ICD-10-CM | POA: Diagnosis not present

## 2024-07-01 DIAGNOSIS — Z87891 Personal history of nicotine dependence: Secondary | ICD-10-CM | POA: Diagnosis not present

## 2024-07-01 DIAGNOSIS — E559 Vitamin D deficiency, unspecified: Secondary | ICD-10-CM | POA: Diagnosis not present

## 2024-07-01 DIAGNOSIS — Z9109 Other allergy status, other than to drugs and biological substances: Secondary | ICD-10-CM | POA: Diagnosis not present

## 2024-08-24 DIAGNOSIS — G43719 Chronic migraine without aura, intractable, without status migrainosus: Secondary | ICD-10-CM | POA: Diagnosis not present

## 2024-08-24 DIAGNOSIS — J3489 Other specified disorders of nose and nasal sinuses: Secondary | ICD-10-CM | POA: Diagnosis not present

## 2024-08-24 DIAGNOSIS — F17210 Nicotine dependence, cigarettes, uncomplicated: Secondary | ICD-10-CM | POA: Diagnosis not present

## 2024-08-24 DIAGNOSIS — M5416 Radiculopathy, lumbar region: Secondary | ICD-10-CM | POA: Diagnosis not present
